# Patient Record
Sex: Female | Born: 1985
Health system: Southern US, Community
[De-identification: ages and names within clinical notes are randomized; demographics above are authoritative.]

---

## 2015-09-03 DIAGNOSIS — T384X5A Adverse effect of oral contraceptives, initial encounter: Secondary | ICD-10-CM | POA: Insufficient documentation

## 2015-09-03 DIAGNOSIS — N926 Irregular menstruation, unspecified: Secondary | ICD-10-CM | POA: Insufficient documentation

## 2017-07-06 DIAGNOSIS — R5383 Other fatigue: Secondary | ICD-10-CM | POA: Insufficient documentation

## 2017-07-06 DIAGNOSIS — R448 Other symptoms and signs involving general sensations and perceptions: Secondary | ICD-10-CM | POA: Insufficient documentation

## 2017-12-09 DIAGNOSIS — Z975 Presence of (intrauterine) contraceptive device: Secondary | ICD-10-CM | POA: Insufficient documentation

## 2021-06-19 ENCOUNTER — Encounter (HOSPITAL_COMMUNITY): Payer: Self-pay

## 2021-06-19 ENCOUNTER — Other Ambulatory Visit: Payer: Self-pay

## 2021-06-19 ENCOUNTER — Ambulatory Visit (HOSPITAL_COMMUNITY)
Admission: EM | Admit: 2021-06-19 | Discharge: 2021-06-19 | Disposition: A | Discharge status: Home / Self Care | Payer: 59 | Attending: Internal Medicine | Admitting: Internal Medicine

## 2021-06-19 DIAGNOSIS — J019 Acute sinusitis, unspecified: Secondary | ICD-10-CM | POA: Diagnosis not present

## 2021-06-19 MED ORDER — AMOXICILLIN-POT CLAVULANATE 875-125 MG PO TABS
1.0000 | ORAL_TABLET | Freq: Two times a day (BID) | ORAL | 0 refills | Status: DC
Start: 2021-06-19 — End: 2021-08-28

## 2021-06-19 MED ORDER — FLUTICASONE PROPIONATE 50 MCG/ACT NA SUSP: 1.0000 | Freq: Every day | NASAL | 0 refills | Status: DC

## 2021-06-19 NOTE — Discharge Instructions (Addendum)
Take medication as prescribed Please keep your appointment with the ENT specialist as scheduled. Return to urgent care if you have any concerns.

## 2021-06-19 NOTE — ED Provider Notes (Signed)
[facility]    CSN: [id number] Arrival date & time: 06/19/21  1328      History   Chief Complaint Chief Complaint  Patient presents with   Nasal Congestion    HPI Chloe Allen is a 35 y.o. female comes to the urgent care with over 2 weeks history of nasal congestion, difficulty breathing out of the left nostril and left-sided facial pain.  Patient says symptoms have worsened over the past 3 days.  No fever or chills.  No cough or sputum production.  Patient denies any purulent nasal discharge.  She has some headache.  Patient has tried over-the-counter fluticasone, saline nasal spray, nasal rinse.   HPI  History reviewed. No pertinent past medical history.  There are no problems to display for this patient.   History reviewed. No pertinent surgical history.  OB History   No obstetric history on file.      Home Medications    Prior to Admission medications   Medication Sig Start Date End Date Taking? Authorizing Provider  amoxicillin-clavulanate (AUGMENTIN) 875-125 MG tablet Take 1 tablet by mouth every 12 (twelve) hours. 06/19/21  Yes Gustavo Dispenza, Britta Mccreedy, MD  fluticasone (FLONASE) 50 MCG/ACT nasal spray Place 1 spray into both nostrils daily. 06/19/21  Yes Jeana Kersting, Britta Mccreedy, MD    Family History History reviewed. No pertinent family history.  Social History Social History   Tobacco Use   Smoking status: Never   Smokeless tobacco: Never  Vaping Use   Vaping Use: Never used  Substance Use Topics   Drug use: Never     Allergies   Kiwi extract   Review of Systems Review of Systems  HENT:  Positive for congestion and nosebleeds. Negative for facial swelling, mouth sores, postnasal drip, rhinorrhea and sore throat.   Respiratory: Negative.    Gastrointestinal: Negative.     Physical Exam Triage Vital Signs ED Triage Vitals  Enc Vitals Group     BP 06/19/21 1403 120/84     Pulse Rate 06/19/21 1403 93     Resp 06/19/21 1403 17     Temp  06/19/21 1403 98.2 F (36.8 C)     Temp Source 06/19/21 1403 Oral     SpO2 06/19/21 1403 97 %     Weight --      Height --      Head Circumference --      Peak Flow --      Pain Score 06/19/21 1401 0     Pain Loc --      Pain Edu? --      Excl. in GC? --    No data found.  Updated Vital Signs BP 120/84 (BP Location: Right Arm)   Pulse 93   Temp 98.2 F (36.8 C) (Oral)   Resp 17   SpO2 97%   Visual Acuity Right Eye Distance:   Left Eye Distance:   Bilateral Distance:    Right Eye Near:   Left Eye Near:    Bilateral Near:     Physical Exam Vitals and nursing note reviewed.  Constitutional:      General: She is not in acute distress.    Appearance: She is not ill-appearing.  HENT:     Right Ear: Tympanic membrane normal.     Left Ear: Tympanic membrane normal.     Ears:     Comments: Tenderness over the left maxillary sinus.  Nasal exam is remarkable for edematous turbinates.    Nose: No  congestion or rhinorrhea.  Cardiovascular:     Rate and Rhythm: Normal rate and regular rhythm.  Musculoskeletal:        General: Normal range of motion.  Neurological:     Mental Status: She is alert.     UC Treatments / Results  Labs (all labs ordered are listed, but only abnormal results are displayed) Labs Reviewed - No data to display  EKG   Radiology No results found.  Procedures Procedures (including critical care time)  Medications Ordered in UC Medications - No data to display  Initial Impression / Assessment and Plan / UC Course  I have reviewed the triage vital signs and the nursing notes.  Pertinent labs & imaging results that were available during my care of the patient were reviewed by me and considered in my medical decision making (see chart for details).     1.  Acute sinusitis with symptoms greater than 10 days: Augmentin 875-125 mg 1 tablet twice daily for 7 days Fluticasone nasal spray Keep your ENT appointment Return to urgent care if  you have any worsening symptoms. Final Clinical Impressions(s) / UC Diagnoses   Final diagnoses:  Acute sinusitis with symptoms > 10 days     Discharge Instructions      Take medication as prescribed Please keep your appointment with the ENT specialist as scheduled. Return to urgent care if you have any concerns.   ED Prescriptions     Medication Sig Dispense Auth. Provider   amoxicillin-clavulanate (AUGMENTIN) 875-125 MG tablet Take 1 tablet by mouth every 12 (twelve) hours. 14 tablet Sincere Liuzzi, Britta Mccreedy, MD   fluticasone (FLONASE) 50 MCG/ACT nasal spray Place 1 spray into both nostrils daily. 16 g Jackey Housey, Britta Mccreedy, MD      PDMP not reviewed this encounter.   Merrilee Jansky, MD 06/19/21 1600

## 2021-06-19 NOTE — ED Triage Notes (Signed)
Pt in with c/o nasal congestion, nasal drainage, headache that has worsened over the last 3 days but started 6 weeks ago  Pt has been taking Otc meds with minor relief

## 2021-07-09 DIAGNOSIS — J343 Hypertrophy of nasal turbinates: Secondary | ICD-10-CM | POA: Diagnosis not present

## 2021-07-09 DIAGNOSIS — J3489 Other specified disorders of nose and nasal sinuses: Secondary | ICD-10-CM | POA: Diagnosis not present

## 2021-07-09 DIAGNOSIS — R0981 Nasal congestion: Secondary | ICD-10-CM | POA: Diagnosis not present

## 2021-07-21 ENCOUNTER — Other Ambulatory Visit (HOSPITAL_BASED_OUTPATIENT_CLINIC_OR_DEPARTMENT_OTHER): Payer: Self-pay | Admitting: Specialist

## 2021-07-21 DIAGNOSIS — G9600 Cerebrospinal fluid leak, unspecified: Secondary | ICD-10-CM

## 2021-07-23 ENCOUNTER — Other Ambulatory Visit: Payer: Self-pay

## 2021-07-23 ENCOUNTER — Ambulatory Visit (HOSPITAL_BASED_OUTPATIENT_CLINIC_OR_DEPARTMENT_OTHER)
Admission: RE | Admit: 2021-07-23 | Discharge: 2021-07-23 | Disposition: A | Discharge status: Home / Self Care | Payer: 59 | Source: Ambulatory Visit | Attending: Specialist | Admitting: Specialist

## 2021-07-23 DIAGNOSIS — J339 Nasal polyp, unspecified: Secondary | ICD-10-CM | POA: Diagnosis not present

## 2021-07-23 DIAGNOSIS — G9600 Cerebrospinal fluid leak, unspecified: Secondary | ICD-10-CM | POA: Insufficient documentation

## 2021-08-28 ENCOUNTER — Ambulatory Visit (INDEPENDENT_AMBULATORY_CARE_PROVIDER_SITE_OTHER): Payer: 59 | Admitting: Family Medicine

## 2021-08-28 ENCOUNTER — Other Ambulatory Visit: Payer: Self-pay

## 2021-08-28 ENCOUNTER — Encounter (HOSPITAL_BASED_OUTPATIENT_CLINIC_OR_DEPARTMENT_OTHER): Payer: Self-pay | Admitting: Family Medicine

## 2021-08-28 VITALS — BP 126/74 | Ht 63.0 in | Wt 189.0 lb

## 2021-08-28 DIAGNOSIS — Z7689 Persons encountering health services in other specified circumstances: Secondary | ICD-10-CM | POA: Diagnosis not present

## 2021-08-28 DIAGNOSIS — Z600 Problems of adjustment to life-cycle transitions: Secondary | ICD-10-CM | POA: Diagnosis not present

## 2021-08-28 DIAGNOSIS — Z0000 Encounter for general adult medical examination without abnormal findings: Secondary | ICD-10-CM | POA: Diagnosis not present

## 2021-08-28 NOTE — Assessment & Plan Note (Signed)
Overall, well-appearing female Discussed general recommendations, particular related to immunizations, cancer screening recommendations Discussed health maintenance and anticipatory guidance She declines influenza vaccine today She will be referred to OB/GYN for continued cervical cancer screening Given family history of various metabolic conditions, discussed role of preventive measures, working on lifestyle modifications to work towards gradual, healthy weight loss We will check labs today as below

## 2021-08-28 NOTE — Progress Notes (Signed)
New Patient Office Visit  Subjective:  Patient ID: Chloe Allen, female    DOB: April 07, 1986  Age: 35 y.o. MRN: [id number]  CC:  Chief Complaint  Patient presents with   Establish Care    Prior PCP at [facility] in [place]. No concerns or complaints today. Would like a wellness exam    HPI Chloe Allen is a 35 year old female presenting to establish in clinic.  She is requesting CPE today.  Denies any significant past medical history.  Reports that she had her tetanus shot in 2014 and is thus up-to-date Reports that she is up-to-date on both dental exams as well as vision screening Recalls having cervical cancer screening completed in 2017 with HPV cotesting and reports that findings from this were normal.  She would thus be due for repeat screening, is interested in referral to OB/GYN to have this completed Reports being up-to-date on coronavirus immunizations Declines flu immunization today  Does report that she has had some issues related to her depressive symptoms, feeling more anxious since the passing of her father in 2020.  She would be interested in referral to undergo counseling regarding this  Patient is [place] from [place].  She was most recently living in [place] which is where she lived for the past 17 years.  She has been in the area here for about a year and a half.  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  She has 2 children who are 63 and 68 years old.  Her husband works as a Adult nurse for [employer].  History reviewed. No pertinent past medical history.  History reviewed. No pertinent surgical history.  Family History  Problem Relation Age of Onset   Hypertension Mother    Hyperlipidemia Father    Hyperlipidemia Maternal Grandmother    Hypertension Maternal Grandmother    Diabetes Maternal Grandmother    Hypertension Maternal Grandfather    Hyperlipidemia Maternal Grandfather    Diabetes Maternal  Grandfather    Hypertension Paternal Grandmother    Hyperlipidemia Paternal Grandmother    Diabetes Paternal Grandmother    Hypertension Paternal Grandfather    Hyperlipidemia Paternal Grandfather    Diabetes Paternal Grandfather     Social History   Socioeconomic History   Marital status: Unknown    Spouse name: Not on file   Number of children: Not on file   Years of education: Not on file   Highest education level: Not on file  Occupational History   Not on file  Tobacco Use   Smoking status: Never   Smokeless tobacco: Never  Vaping Use   Vaping Use: Never used  Substance and Sexual Activity   Alcohol use: Yes    Comment: occasionally   Drug use: Never   Sexual activity: Yes  Other Topics Concern   Not on file  Social History Narrative   Not on file   Social Determinants of Health   Financial Resource Strain: Not on file  Food Insecurity: Not on file  Transportation Needs: Not on file  Physical Activity: Not on file  Stress: Not on file  Social Connections: Not on file  Intimate Partner Violence: Not on file    Objective:   Today's Vitals: BP 126/74   Ht 5\' 3"  (1.6 m)   Wt 189 lb (85.7 kg)   LMP 07/26/2021   BMI 33.48 kg/m   Physical Exam Constitutional:      General: She is not in acute distress.  Appearance: Normal appearance.  HENT:     Head: Normocephalic and atraumatic.     Right Ear: External ear normal.     Left Ear: External ear normal.     Nose: Nose normal. No rhinorrhea.     Mouth/Throat:     Mouth: Mucous membranes are moist.     Pharynx: Oropharynx is clear.  Eyes:     Extraocular Movements: Extraocular movements intact.     Conjunctiva/sclera: Conjunctivae normal.     Pupils: Pupils are equal, round, and reactive to light.  Cardiovascular:     Rate and Rhythm: Normal rate and regular rhythm.     Pulses: Normal pulses.     Heart sounds: Normal heart sounds. No murmur heard. Pulmonary:     Effort: Pulmonary effort is normal.  No respiratory distress.     Breath sounds: Normal breath sounds.  Abdominal:     General: Bowel sounds are normal. There is no distension.     Palpations: Abdomen is soft. There is no mass.     Tenderness: There is no abdominal tenderness. There is no guarding.  Musculoskeletal:     Cervical back: Normal range of motion and neck supple.  Skin:    General: Skin is warm and dry.  Neurological:     General: No focal deficit present.     Mental Status: She is alert and oriented to person, place, and time.  Psychiatric:        Mood and Affect: Mood normal.        Thought Content: Thought content normal.    Assessment & Plan:   Problem List Items Addressed This Visit       Other   Wellness examination - Primary    Overall, well-appearing female Discussed general recommendations, particular related to immunizations, cancer screening recommendations Discussed health maintenance and anticipatory guidance She declines influenza vaccine today She will be referred to OB/GYN for continued cervical cancer screening Given family history of various metabolic conditions, discussed role of preventive measures, working on lifestyle modifications to work towards gradual, healthy weight loss We will check labs today as below      Relevant Orders   CBC with Differential/Platelet   Comprehensive metabolic panel   Lipid panel   Hemoglobin A1c   TSH Rfx on Abnormal to Free T4   Vitamin B12   VITAMIN D 25 Hydroxy (Vit-D Deficiency, Fractures)   Phase of life problem    Discussed options related to current symptoms which began after the passing of her father Patient is interested in proceeding with counseling, place referral to Dr. Marge Duncans office Patient would prefer to follow-up with [place] as needed, advised that if she has any further questions or concerns to schedule follow-up to discuss alternative treatment options such as pharmacotherapy      Other Visit Diagnoses     Establishing care  with new doctor, encounter for       Relevant Orders   Ambulatory referral to Obstetrics / Gynecology       Outpatient Encounter Medications as of 08/28/2021  Medication Sig   [DISCONTINUED] amoxicillin-clavulanate (AUGMENTIN) 875-125 MG tablet Take 1 tablet by mouth every 12 (twelve) hours.   [DISCONTINUED] fluticasone (FLONASE) 50 MCG/ACT nasal spray Place 1 spray into both nostrils daily.   No facility-administered encounter medications on file as of 08/28/2021.    Follow-up: No follow-ups on file.  Plan for follow-up in about 9 months to complete CPE or sooner as needed.  Hosie Poisson Peru,  MD

## 2021-08-28 NOTE — Assessment & Plan Note (Signed)
Discussed options related to current symptoms which began after the passing of her father Patient is interested in proceeding with counseling, place referral to Dr. Marge Duncans office Patient would prefer to follow-up with [place] as needed, advised that if she has any further questions or concerns to schedule follow-up to discuss alternative treatment options such as pharmacotherapy

## 2021-08-28 NOTE — Patient Instructions (Signed)
  Medication Instructions:  Your physician recommends that you continue on your current medications as directed. Please refer to the Current Medication list given to you today. --If you need a refill on any your medications before your next appointment, please call your pharmacy first. If no refills are authorized on file call the office.-- Lab Work: Your physician has recommended that you have lab work today: CBC, CMP, Lipid, Vit B12, Vit D, A1C If you have labs (blood work) drawn today and your tests are completely normal, you will receive your results via MyChart message OR a phone call from our staff.  Please ensure you check your voicemail in the event that you authorized detailed messages to be left on a delegated number. If you have any lab test that is abnormal or we need to change your treatment, we will call you to review the results.  Referrals/Procedures/Imaging: A referral has been placed for you to Dr. Bosie Clos with [facility]. Dr. Bosie Clos is a psychologist who specializes in cognitive and behavioral therapy, he does not prescribe medications. Someone from the scheduling department will be in contact with you in regards to coordinating your consultation. If you do not hear from any of the schedulers within 7-10 business days please give our office a call   Follow-Up: Your next appointment:   Your physician recommends that you schedule a follow-up appointment in: 9 MONTHS with Dr. de Peru  You will receive a text message or e-mail with a link to a survey about your care and experience with [place] today! We would greatly appreciate your feedback!   Thanks for letting us be apart of your health journey!!  Primary Care and Sports Medicine   Dr. Ceasar Mons Peru   We encourage you to activate your patient portal called "MyChart".  Sign up information is provided on this After Visit Summary.  MyChart is used to connect with patients for Virtual Visits (Telemedicine).   Patients are able to view lab/test results, encounter notes, upcoming appointments, etc.  Non-urgent messages can be sent to your provider as well. To learn more about what you can do with MyChart, please visit --  [email or web address].

## 2021-08-29 LAB — CBC WITH DIFFERENTIAL/PLATELET
Basophils Absolute: 0 10*3/uL (ref 0.0–0.2)
Basos: 0 %
EOS (ABSOLUTE): 0.3 10*3/uL (ref 0.0–0.4)
Eos: 6 %
Hematocrit: 38.7 % (ref 34.0–46.6)
Hemoglobin: 12.6 g/dL (ref 11.1–15.9)
Immature Grans (Abs): 0 10*3/uL (ref 0.0–0.1)
Immature Granulocytes: 0 %
Lymphocytes Absolute: 1.8 10*3/uL (ref 0.7–3.1)
Lymphs: 37 %
MCH: 26.8 pg (ref 26.6–33.0)
MCHC: 32.6 g/dL (ref 31.5–35.7)
MCV: 82 fL (ref 79–97)
Monocytes Absolute: 0.5 10*3/uL (ref 0.1–0.9)
Monocytes: 11 %
Neutrophils Absolute: 2.2 10*3/uL (ref 1.4–7.0)
Neutrophils: 46 %
Platelets: 246 10*3/uL (ref 150–450)
RBC: 4.7 x10E6/uL (ref 3.77–5.28)
RDW: 13.9 % (ref 11.7–15.4)
WBC: 4.8 10*3/uL (ref 3.4–10.8)

## 2021-08-29 LAB — HEMOGLOBIN A1C
Est. average glucose Bld gHb Est-mCnc: 114 mg/dL
Hgb A1c MFr Bld: 5.6 % (ref 4.8–5.6)

## 2021-08-29 LAB — COMPREHENSIVE METABOLIC PANEL
ALT: 18 IU/L (ref 0–32)
AST: 16 IU/L (ref 0–40)
Albumin/Globulin Ratio: 1.7 (ref 1.2–2.2)
Albumin: 4.4 g/dL (ref 3.8–4.8)
Alkaline Phosphatase: 81 IU/L (ref 44–121)
BUN/Creatinine Ratio: 15 (ref 9–23)
BUN: 11 mg/dL (ref 6–20)
Bilirubin Total: <0.2 mg/dL (ref 0.0–1.2)
CO2: 22 mmol/L (ref 20–29)
Calcium: 8.9 mg/dL (ref 8.7–10.2)
Chloride: 106 mmol/L (ref 96–106)
Creatinine, Ser: 0.74 mg/dL (ref 0.57–1.00)
Globulin, Total: 2.6 g/dL (ref 1.5–4.5)
Glucose: 90 mg/dL (ref 70–99)
Potassium: 4.6 mmol/L (ref 3.5–5.2)
Sodium: 143 mmol/L (ref 134–144)
Total Protein: 7 g/dL (ref 6.0–8.5)
eGFR: 107 mL/min/{1.73_m2} (ref >59)

## 2021-08-29 LAB — LIPID PANEL
Chol/HDL Ratio: 2.9 ratio (ref 0.0–4.4)
Cholesterol, Total: 162 mg/dL (ref 100–199)
HDL: 56 mg/dL (ref >39)
LDL Chol Calc (NIH): 94 mg/dL (ref 0–99)
Triglycerides: 62 mg/dL (ref 0–149)
VLDL Cholesterol Cal: 12 mg/dL (ref 5–40)

## 2021-08-29 LAB — TSH RFX ON ABNORMAL TO FREE T4: TSH: 2.65 u[IU]/mL (ref 0.450–4.500)

## 2021-08-29 LAB — VITAMIN B12: Vitamin B-12: 627 pg/mL (ref 232–1245)

## 2021-08-29 LAB — VITAMIN D 25 HYDROXY (VIT D DEFICIENCY, FRACTURES): Vit D, 25-Hydroxy: 19.7 ng/mL — ABNORMAL LOW (ref 30.0–100.0)

## 2021-08-31 ENCOUNTER — Other Ambulatory Visit (HOSPITAL_BASED_OUTPATIENT_CLINIC_OR_DEPARTMENT_OTHER): Payer: Self-pay

## 2021-08-31 ENCOUNTER — Telehealth (HOSPITAL_BASED_OUTPATIENT_CLINIC_OR_DEPARTMENT_OTHER): Payer: Self-pay

## 2021-08-31 MED ORDER — VITAMIN D3 25 MCG (1000 UT) PO CAPS
1000.0000 [IU] | ORAL_CAPSULE | Freq: Every day | ORAL | 0 refills | Status: DC
  Filled 2021-08-31: qty 90, 90d supply, fill #0

## 2021-08-31 NOTE — Telephone Encounter (Signed)
Patient is aware and agreeable to lab results Patient requests to have vit d prescription sent to pharmacy Patient is agreeable to supplementation and rechecking levels in 3 mos

## 2021-08-31 NOTE — Telephone Encounter (Signed)
-----   Message from Hosie Poisson Peru, MD sent at 08/31/2021  8:00 AM EDT ----- White blood cell count and red blood cell count are normal with normal hemoglobin.  Electrolytes, kidney function and liver function are all normal.  Cholesterol panel is normal.  Hemoglobin A1c is within normal range at 5.6%.  Thyroid function is normal.  Vitamin B12 level is normal.  Vitamin D is low at 19.7.  Would recommend proceeding with vitamin D supplementation with 1000 international units daily.  This can be completed with over-the-counter supplement or by prescription as per patient preference.  Would plan to recheck vitamin D level after about 3 months of supplementation.

## 2021-09-01 ENCOUNTER — Other Ambulatory Visit (HOSPITAL_BASED_OUTPATIENT_CLINIC_OR_DEPARTMENT_OTHER): Payer: Self-pay

## 2021-10-09 ENCOUNTER — Ambulatory Visit: Payer: 59 | Admitting: Psychologist

## 2021-11-13 ENCOUNTER — Ambulatory Visit (INDEPENDENT_AMBULATORY_CARE_PROVIDER_SITE_OTHER): Payer: 59 | Admitting: Psychologist

## 2021-11-13 DIAGNOSIS — Z634 Disappearance and death of family member: Secondary | ICD-10-CM

## 2021-11-13 DIAGNOSIS — F3281 Premenstrual dysphoric disorder: Secondary | ICD-10-CM | POA: Diagnosis not present

## 2021-11-13 NOTE — Progress Notes (Signed)
                Elloise Roark, PsyD 

## 2021-11-13 NOTE — Plan of Care (Signed)
Goals °Alleviate depressive symptoms °Recognize, accept, and cope with depressive feelings °Develop healthy thinking patterns °Develop healthy interpersonal relationships ° °Objectives °Cooperate with a medication evaluation by a physician °Verbalize an accurate understanding of depression °Verbalize an understanding of the treatment °Identify and replace thoughts that support depression °Learn and implement behavioral strategies °Verbalize an understanding and resolution of current interpersonal problems °Learn and implement problem solving and decision making skills °Learn and implement conflict resolution skills to resolve interpersonal problems °Verbalize an understanding of healthy and unhealthy emotions verbalize insight into how past relationships may be influence current experiences with depression °Use mindfulness and acceptance strategies and increase value based behavior  °Increase hopeful statements about the future.  °Interventions °Consistent with treatment model, discuss how change in cognitive, behavioral, and interpersonal can help client alleviate depression °CBT °Behavioral activation help the client explore the relationship, nature of the dispute,  °Help the client develop new interpersonal skills and relationships °Conduct Problem so living therapy °Teach conflict resolution skills °Use a process-experiential approach °Conduct TLDP °Conduct ACT °Evaluate need for psychotropic medication °Monitor adherence to medication  ° °Goals °Begin a healthy grieving process °Objectives °Tell in detail the story of the current loss that is triggering symptoms °Read books on the topic of grief °Watch videos on the theme of grief °Begin verbalizing feelings associated with the loss °Attend a grief support group °express thoughts and feelings about the deceased °Identify and voice positives about the deceased °implement acts of spiritual faith  °Interventions °create a safe environment and actively build  trust °use empathy, compassion, and support °ask the patient to write a letter to the lost person °conduct empty chair °ask the patient to discuss and list the positives and negative aspects of the person °encourage patient to rely upon his/her spiritual faith  °ask client to read books on grief °ask patient to watch videos about grief °assist patient in identifying emotions  °ask patient to attend support group  ° °

## 2021-11-13 NOTE — Progress Notes (Signed)
[facility] Counselor Initial Adult Exam  Name: Chloe Allen Date: 11/13/2021 MRN: [id number] DOB: [birth year] PCP: de Peru, Raymond J, MD  Time spent: 9:04 am to 9:30 am; total time: 26 minutes.   This session was held via video webex teletherapy due to the coronavirus risk at this time. The patient consented to video teletherapy and was located at her home during this session. She is aware it is the responsibility of the patient to secure confidentiality on her end of the session. The provider was in a private home office for the duration of this session. Limits of confidentiality were discussed with the patient.   Guardian/Payee:  NA    Paperwork requested: No   Reason for Visit /Presenting Problem: Patient is exhibiting premenstrual dysphoric symptoms and grief related symptoms.   Mental Status Exam: Appearance:   Neat     Behavior:  Appropriate  Motor:  Normal  Speech/Language:   Normal Rate  Affect:  Appropriate  Mood:  normal  Thought process:  normal  Thought content:    WNL  Sensory/Perceptual disturbances:    WNL  Orientation:  oriented to person, place, time/date, and situation  Attention:  Good  Concentration:  Good  Memory:  WNL  Fund of knowledge:   Good  Insight:    Fair  Judgment:   Good  Impulse Control:  Good    Reported Symptoms:  The patient endorsed experiencing the following: marked affective lability, irritability, feeling down, anxiety, being overwhelmed, and out of control. She denied suicidal and homicidal ideation. Per the patient, these symptoms are present at least one week before the onset of menses and then dissipate postmenses.   Risk Assessment: Danger to Self:  No Self-injurious Behavior: No Danger to Others: No Duty to Warn:no Physical Aggression / Violence:No  Access to Firearms a concern: No  Gang Involvement:No  Patient / guardian was educated about steps to take if suicide or homicide risk level increases between  visits: n/a While future psychiatric events cannot be accurately predicted, the patient does not currently require acute inpatient psychiatric care and does not currently meet [facility] involuntary commitment criteria.  Substance Abuse History: Current substance abuse: No     Past Psychiatric History:   Previous psychological history is significant for Grief Outpatient Providers:NA History of Psych Hospitalization: No  Psychological Testing:  NA    Abuse History:  Victim of: No.,  NA    Report needed: No. Victim of Neglect:No. Perpetrator of  NA   Witness / Exposure to Domestic Violence: No   Protective Services Involvement: No  Witness to [employer] Violence:  No   Family History:  Family History  Problem Relation Age of Onset   Hypertension Mother    Hyperlipidemia Father    Hyperlipidemia Maternal Grandmother    Hypertension Maternal Grandmother    Diabetes Maternal Grandmother    Hypertension Maternal Grandfather    Hyperlipidemia Maternal Grandfather    Diabetes Maternal Grandfather    Hypertension Paternal Grandmother    Hyperlipidemia Paternal Grandmother    Diabetes Paternal Grandmother    Hypertension Paternal Grandfather    Hyperlipidemia Paternal Grandfather    Diabetes Paternal Grandfather     Living situation: the patient lives with their family. Patient lives with her husband, and two children  Sexual Orientation: Straight  Relationship Status: married  Name of spouse / other:Karmesh If a parent, number of children / ages:Patient has two children who are 28 and 84 years old.   Support Systems:  spouse  Financial Stress:  No   Income/Employment/Disability: Employment. Patient works as a NP  Financial planner: No   Educational History: Education: post Engineer, maintenance (IT) work or degree  Religion/Sprituality/World View: Hindu  Any cultural differences that may affect / interfere with treatment:  not applicable   Recreation/Hobbies: Spending time  with family  Stressors: Loss of father    Strengths: Supportive Relationships and Family  Barriers:  NA   Legal History: Pending legal issue / charges: The patient has no significant history of legal issues. History of legal issue / charges:  NA  Medical History/Surgical History: reviewed No past medical history on file.  No past surgical history on file.  Medications: Current Outpatient Medications  Medication Sig Dispense Refill   Cholecalciferol (VITAMIN D3) 25 MCG (1000 UT) CAPS Take 1 capsule (1,000 Units total) by mouth daily. 90 capsule 0   No current facility-administered medications for this visit.    Allergies  Allergen Reactions   Kiwi Extract    Latex    Pineapple     Diagnoses:  F32.81 premenstrual dysphoric disorder  Plan of Care: The patient is a 36 year old woman who identifies with the Hindu culture who was referred for counseling. The patient lives at home with her husband, and two children. The patient meets criteria for a diagnosis of F32.81 premenstrual dysphoric disorder based off of the following: marked affective lability, irritability, feeling down, anxiety, being overwhelmed, and out of control. She denied suicidal and homicidal ideation. Per the patient, these symptoms are present at least one week before the onset of menses and then dissipate postmenses. She also meets criteria for a diagnosis of Z63.4 uncomplicated bereavement.   The patient stated that she wanted coping skills and to work through grief  This psychologist makes the recommendation that the patient participate in at least bi-weekly therapy to assist her in addressing her goals.  Hilbert Corrigan, PsyD

## 2021-12-04 ENCOUNTER — Ambulatory Visit: Payer: 59 | Admitting: Psychologist

## 2022-03-25 ENCOUNTER — Encounter (HOSPITAL_BASED_OUTPATIENT_CLINIC_OR_DEPARTMENT_OTHER): Payer: Self-pay | Admitting: Family Medicine

## 2022-03-25 ENCOUNTER — Ambulatory Visit (HOSPITAL_BASED_OUTPATIENT_CLINIC_OR_DEPARTMENT_OTHER): Payer: 59 | Admitting: Family Medicine

## 2022-03-25 VITALS — BP 122/82 | HR 86 | Ht 64.0 in | Wt 199.0 lb

## 2022-03-25 DIAGNOSIS — Z111 Encounter for screening for respiratory tuberculosis: Secondary | ICD-10-CM

## 2022-03-25 DIAGNOSIS — Z139 Encounter for screening, unspecified: Secondary | ICD-10-CM | POA: Diagnosis not present

## 2022-03-25 NOTE — Assessment & Plan Note (Signed)
Patient presenting to have form completed related to upcoming school program.  Form requesting documentation for prior vaccine administration or verification of immunity with titers.  She has had titers completed previously and has documentation with her today of this prior testing.  She is in need of tuberculosis screening either through two-step process or QuantiFERON gold.  She prefers to proceed with QuantiFERON testing, order placed today.  She denies any other acute concerns or new issues since last appointment which was physical exam about 7 months ago.  She did not receive most recent influenza vaccine, however we are no longer in flu season.  Discussed options with patient, indicated on form that we are no longer in flu season and thus would not be required to have flu shot at this time.  If any further is needed from her program, she will let us know. Form completed for patient, copy of form as well as immunization records/titer results will be scanned into chart

## 2022-03-25 NOTE — Progress Notes (Signed)
    Procedures performed today:    None.  Independent interpretation of notes and tests performed by another provider:   None.  Brief History, Exam, Impression, and Recommendations:    BP 122/82   Pulse 86   Ht 5\' 4"  (1.626 m)   Wt 199 lb (90.3 kg)   SpO2 96%   BMI 34.16 kg/m   Encounter for screening Patient presenting to have form completed related to upcoming school program.  Form requesting documentation for prior vaccine administration or verification of immunity with titers.  She has had titers completed previously and has documentation with her today of this prior testing.  She is in need of tuberculosis screening either through two-step process or QuantiFERON gold.  She prefers to proceed with QuantiFERON testing, order placed today.  She denies any other acute concerns or new issues since last appointment which was physical exam about 7 months ago.  She did not receive most recent influenza vaccine, however we are no longer in flu season.  Discussed options with patient, indicated on form that we are no longer in flu season and thus would not be required to have flu shot at this time.  If any further is needed from her program, she will let know. Form completed for patient, copy of form as well as immunization records/titer results will be scanned into chart  Plan for follow-up in about 2 months at previously scheduled appointment for CPE   ___________________________________________ Chloe Massing de [place], MD, ABFM, CAQSM Primary Care and Sports Medicine [facility]

## 2022-03-25 NOTE — Patient Instructions (Signed)
  Medication Instructions:  Your physician recommends that you continue on your current medications as directed. Please refer to the Current Medication list given to you today. --If you need a refill on any your medications before your next appointment, please call your pharmacy first. If no refills are authorized on file call the office.-- Lab Work: Your physician has recommended that you have lab work today: TB  If you have labs (blood work) drawn today and your tests are completely normal, you will receive your results via MyChart message OR a phone call from our staff.  Please ensure you check your voicemail in the event that you authorized detailed messages to be left on a delegated number. If you have any lab test that is abnormal or we need to change your treatment, we will call you to review the results.    Follow-Up: Your next appointment:   Your physician recommends that you schedule a follow-up appointment in: Keep appointment in July with Dr. de [place]  You will receive a text message or e-mail with a link to a survey about your care and experience with [place] today! We would greatly appreciate your feedback!   Thanks for letting us be apart of your health journey!!  Primary Care and Sports Medicine   Dr. Ceasar Mons [place]   We encourage you to activate your patient portal called "MyChart".  Sign up information is provided on this After Visit Summary.  MyChart is used to connect with patients for Virtual Visits (Telemedicine).  Patients are able to view lab/test results, encounter notes, upcoming appointments, etc.  Non-urgent messages can be sent to your provider as well. To learn more about what you can do with MyChart, please visit --  [email or web address].

## 2022-03-31 LAB — QUANTIFERON-TB GOLD PLUS

## 2022-04-01 ENCOUNTER — Encounter (HOSPITAL_BASED_OUTPATIENT_CLINIC_OR_DEPARTMENT_OTHER): Payer: Self-pay | Admitting: Family Medicine

## 2022-04-01 ENCOUNTER — Other Ambulatory Visit (HOSPITAL_BASED_OUTPATIENT_CLINIC_OR_DEPARTMENT_OTHER): Payer: Self-pay

## 2022-04-01 DIAGNOSIS — Z111 Encounter for screening for respiratory tuberculosis: Secondary | ICD-10-CM

## 2022-04-13 DIAGNOSIS — Z111 Encounter for screening for respiratory tuberculosis: Secondary | ICD-10-CM | POA: Diagnosis not present

## 2022-04-16 LAB — QUANTIFERON-TB GOLD PLUS
QuantiFERON Mitogen Value: >10 IU/mL
QuantiFERON Nil Value: 0.02 IU/mL
QuantiFERON TB1 Ag Value: 0.02 IU/mL
QuantiFERON TB2 Ag Value: 0.03 IU/mL
QuantiFERON-TB Gold Plus: NEGATIVE

## 2022-04-16 NOTE — Telephone Encounter (Signed)
Spoke with patient due to miscommunication between patient and our office.  Communicated negative lab result and confirmed patient does not need to repeat test. Patient will be able to view result in MyChart.

## 2022-05-25 ENCOUNTER — Ambulatory Visit (HOSPITAL_BASED_OUTPATIENT_CLINIC_OR_DEPARTMENT_OTHER): Payer: 59 | Admitting: Advanced Practice Midwife

## 2022-05-28 ENCOUNTER — Encounter (HOSPITAL_BASED_OUTPATIENT_CLINIC_OR_DEPARTMENT_OTHER): Payer: 59 | Admitting: Family Medicine

## 2022-05-31 ENCOUNTER — Encounter (HOSPITAL_BASED_OUTPATIENT_CLINIC_OR_DEPARTMENT_OTHER): Payer: Self-pay | Admitting: Family Medicine

## 2022-07-20 ENCOUNTER — Encounter (HOSPITAL_BASED_OUTPATIENT_CLINIC_OR_DEPARTMENT_OTHER): Payer: 59 | Admitting: Advanced Practice Midwife

## 2022-08-28 IMAGING — CT CT MAXILLOFACIAL W/O CM
3 series · 16 of 47 positions shown, 19 images · non-contrast
Comparison: None.

CLINICAL DATA: CSF leak

EXAM:
CT MAXILLOFACIAL WITHOUT CONTRAST
TECHNIQUE: Multidetector CT imaging of the maxillofacial structures was
performed. Multiplanar CT image reconstructions were also generated.

[Series 2: 1 max soft · axial · 0.32mm/px · z∈[-143,-9]mm · 10 of 79 slices shown, 13 images]
[im 6/79  brain]
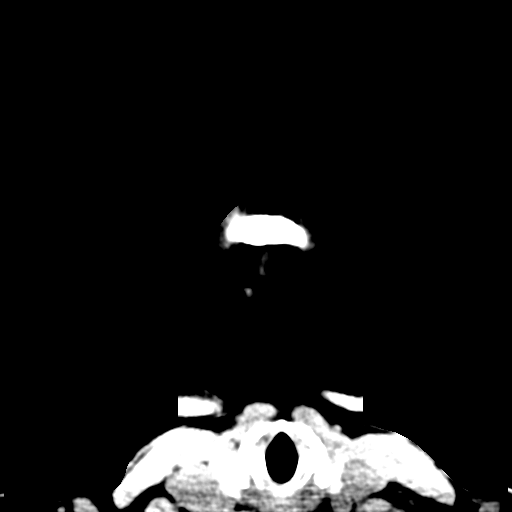
[im 6/79  bone]
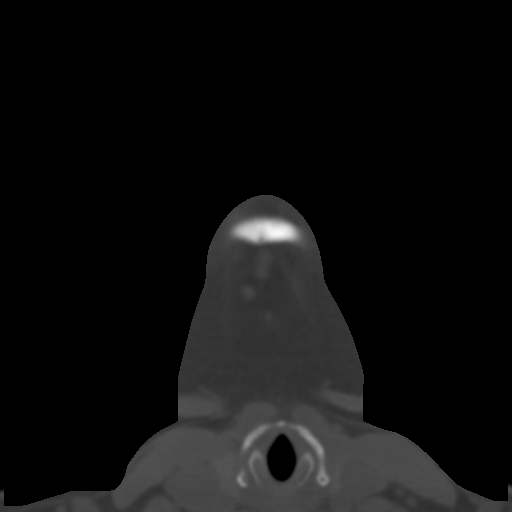
[im 14/79  bone]
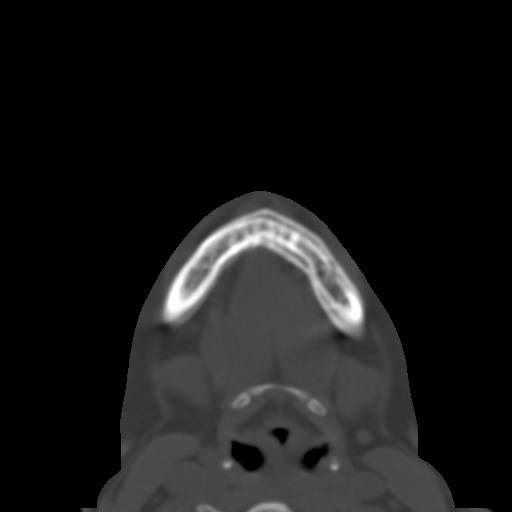
[im 22/79  bone]
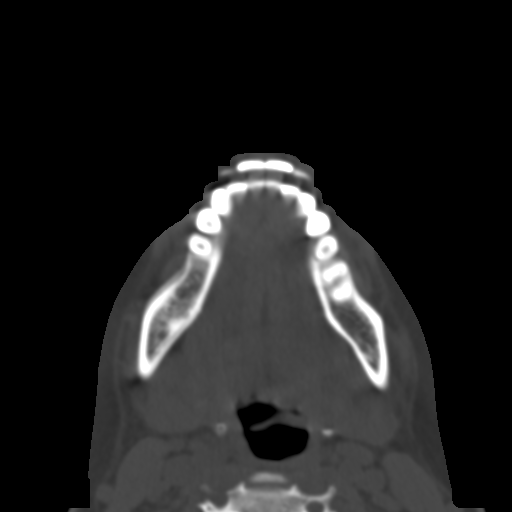
[im 27/79  bone]
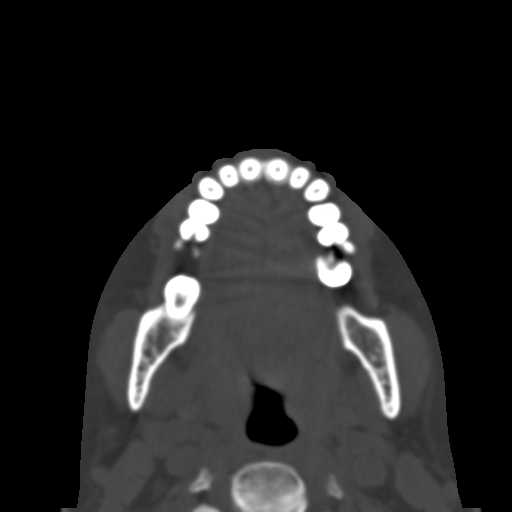
[im 35/79  brain]
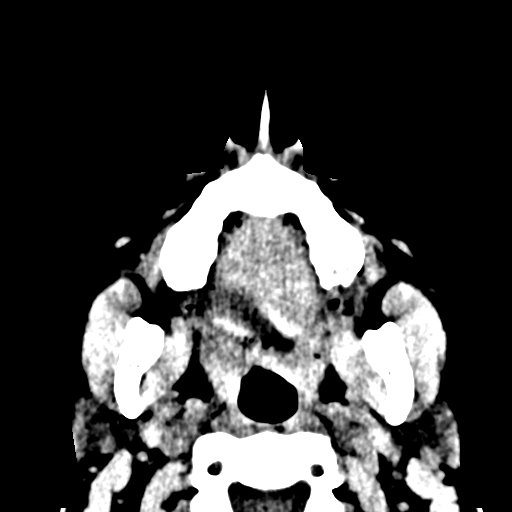
[im 35/79  bone]
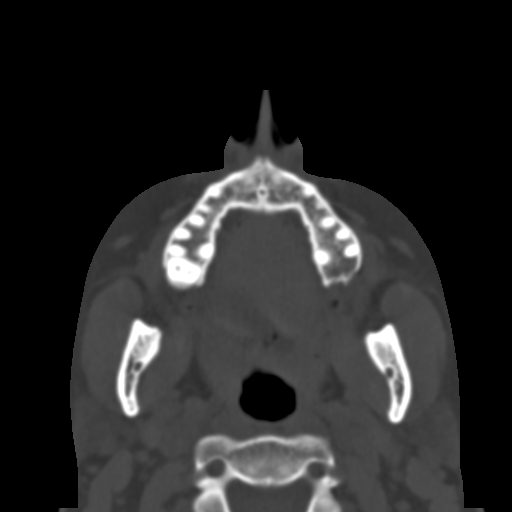
[im 44/79  bone]
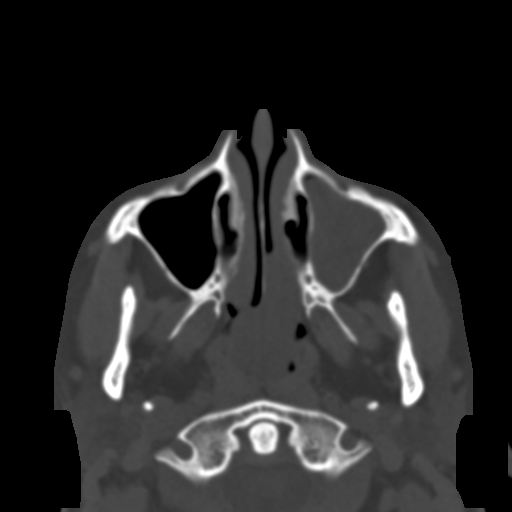
[im 52/79  bone]
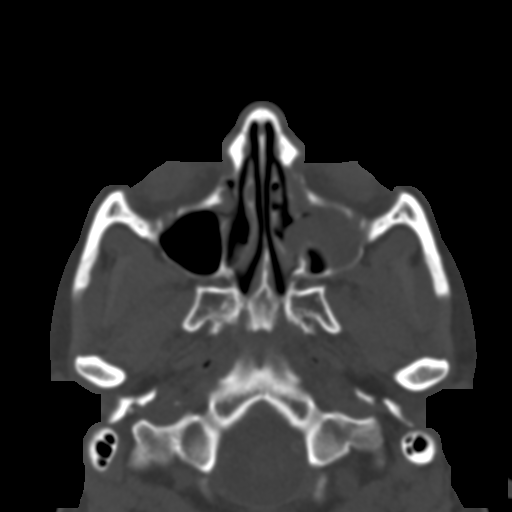
[im 60/79  bone]
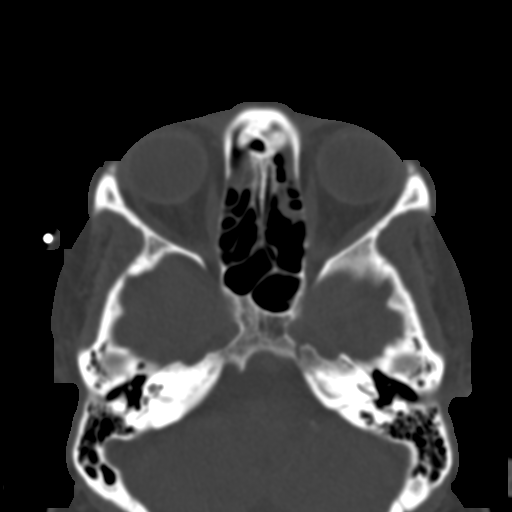
[im 65/79  brain]
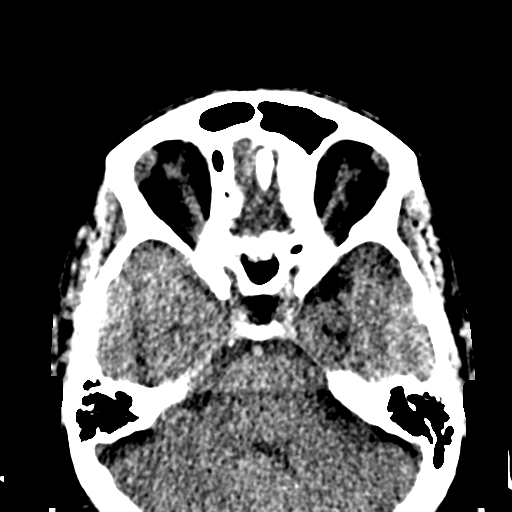
[im 65/79  bone]
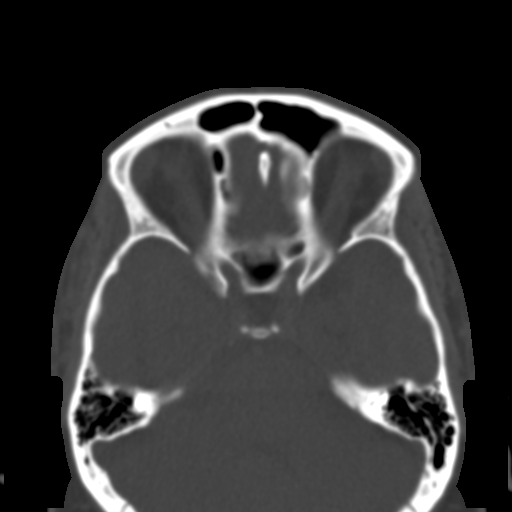
[im 73/79  bone]
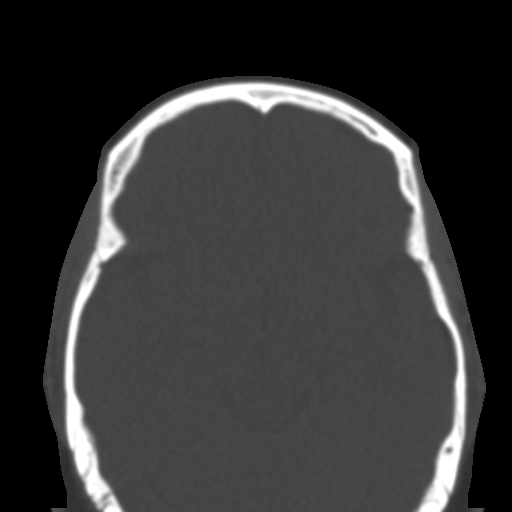

[Series 6: coronal soft · coronal · 0.32mm/px · 3 of 71 slices shown]
[im 24/71  bone]
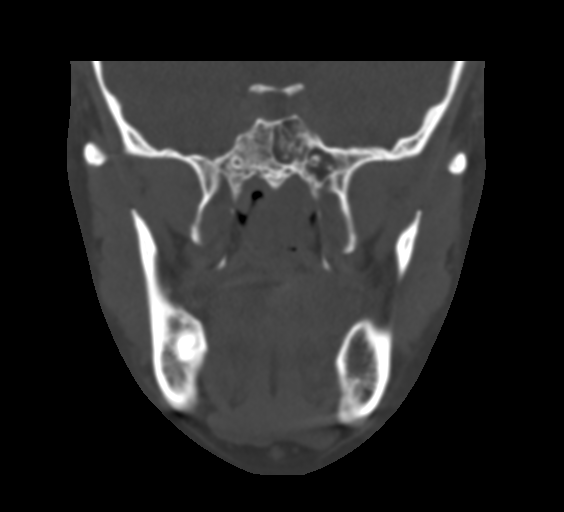
[im 32/71  bone]
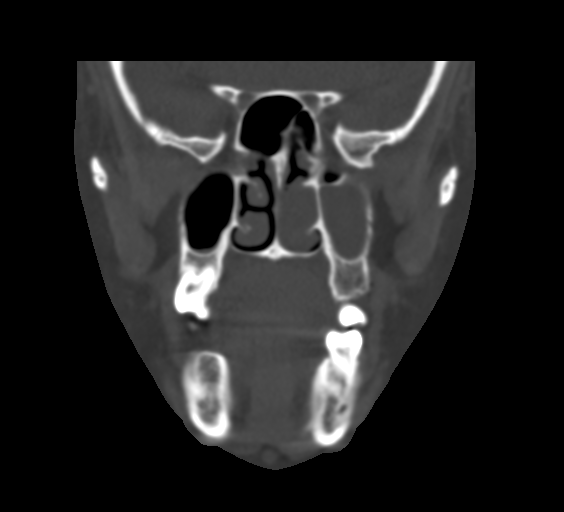
[im 39/71  bone]
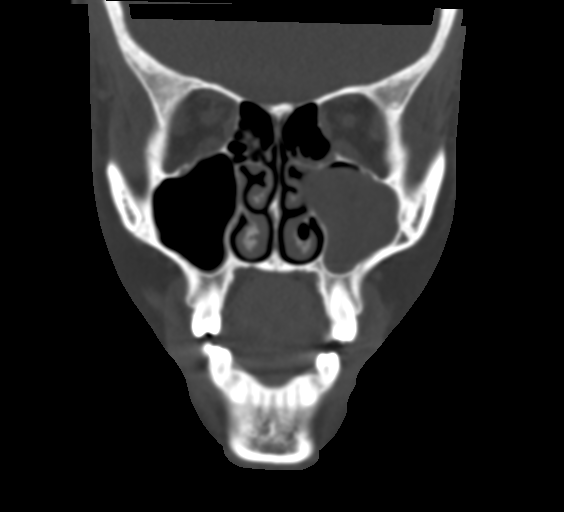

[Series 7: sagittal soft · sagittal · 0.28mm/px · 3 of 92 slices shown]
[im 31/92  bone]
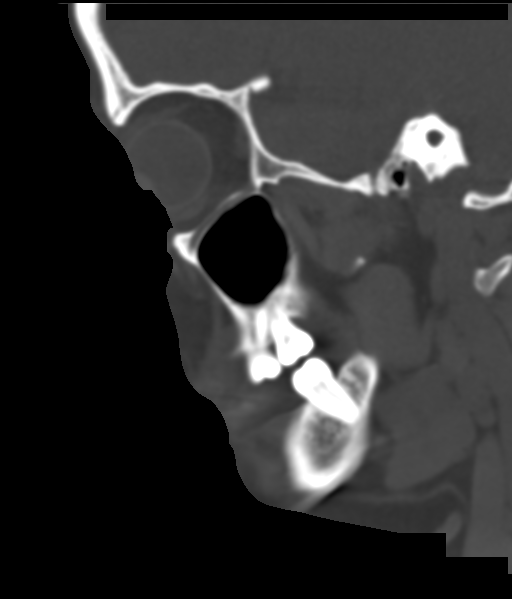
[im 46/92  bone]
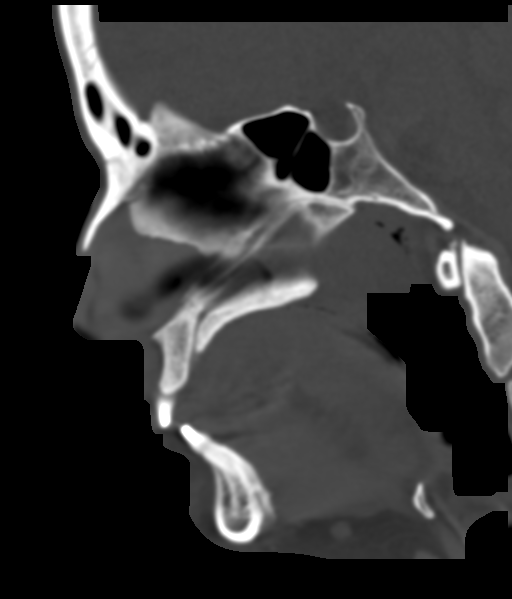
[im 61/92  bone]
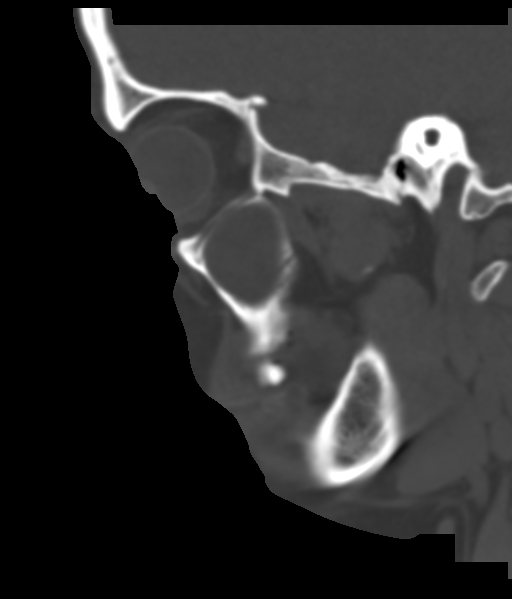

[16 of 47 positions shown; findings below may reference images not displayed]

FINDINGS: Osseous: Cribriform plate and fovea ethmoidalis appear intact. No
definite skull base defect.

Orbits: Unremarkable.

Sinuses: Near total opacification left maxillary sinus with lobular
tissue extending into the nasal cavity and nasopharynx. Left
ostiomeatal unit is occluded. Mild right maxillary sinus mucosal
thickening. Occluded right infundibulum. Patchy ethmoid mucosal
thickening. Opacified frontal recesses. Minimal sphenoid sinus
retained secretions. Sphenoethmoidal recesses are patent. Left
superior meatus is occluded by above lobular tissue.

Soft tissues: Unremarkable.

Limited intracranial: Unremarkable.
IMPRESSION: No definite defect of the cribriform plate or ethmoid roofs.

Left antrochoanal polyp.

## 2022-09-29 DIAGNOSIS — N943 Premenstrual tension syndrome: Secondary | ICD-10-CM | POA: Diagnosis not present

## 2022-09-30 ENCOUNTER — Ambulatory Visit: Payer: 59 | Admitting: Podiatry

## 2022-09-30 DIAGNOSIS — B070 Plantar wart: Secondary | ICD-10-CM

## 2022-09-30 NOTE — Progress Notes (Signed)
Subjective:   Patient ID: Chloe Allen, female   DOB: 36 y.o.   MRN: [id number]   HPI Chief Complaint  Patient presents with   Callouses    Patient came in today for 2x possible plantar wart right foot,  started in June, patient is having some pain, shooting sharp pain, patient would like to have them removed,     36 year old female presents the office today with above concerns.  This started in June. She was using OTC corn removers on the lesions but it did not help. She states it hurts with walking/pressure. When she takes the pressure off she gets shooting pain. No injuries.    Review of Systems  All other systems reviewed and are negative.  No past medical history on file.  No past surgical history on file.   Current Outpatient Medications:    diclofenac Sodium (VOLTAREN) 1 % GEL, Apply 2 g topically 4 (four) times daily. Rub into affected area of foot 2 to 4 times daily, Disp: 100 g, Rfl: 2   Cholecalciferol (VITAMIN D3) 25 MCG (1000 UT) CAPS, Take 1 capsule (1,000 Units total) by mouth daily. (Patient not taking: Reported on 09/30/2022), Disp: 90 capsule, Rfl: 0  Allergies  Allergen Reactions   Kiwi Extract    Latex    Pineapple           Objective:  Physical Exam  General: AAO x3, NAD  Dermatological: 2 areas of hyperkeratotic lesion, verruca is present.  1 is in the right heel as well as the distal arch.  There is minimal pinpoint bleeding with evidence of verruca.  There is no surrounding edema, erythema, drainage or pus or any signs of infection.  There is no open lesions.  Vascular: Dorsalis Pedis artery and Posterior Tibial artery pedal pulses are 2/4 bilateral with immedate capillary fill time. There is no pain with calf compression, swelling, warmth, erythema.   Neruologic: Grossly intact via light touch bilateral.   Musculoskeletal: Tenderness the skin lesions but no other areas of discomfort.  Gait: Unassisted, Nonantalgic.        Assessment:    Plantar wart     Plan:  -Treatment options discussed including all alternatives, risks, and complications -Etiology of symptoms were discussed -Sharply debrided the lesion today without any complications or bleeding.  Cantharone was applied followed by an occlusive bandage.  Postprocedure instructions discussed.  Tolerated the procedure well. -if not improvement she is going to contact us and she will contact the office and I can order topical medication to apply to the skin lesions.   Vivi Barrack DPM

## 2022-09-30 NOTE — Patient Instructions (Signed)
Take dressing off in 8 hours and wash the foot with soap and water. If it is hurting or becomes uncomfortable before the 8 hours, go ahead and remove the bandage and wash the area.  If it blisters, apply antibiotic ointment and a band-aid.  Monitor for any signs/symptoms of infection. Call the office immediately if any occur or go directly to the emergency room. Call with any questions/concerns.   

## 2022-10-01 ENCOUNTER — Encounter: Payer: Self-pay | Admitting: Podiatry

## 2022-10-01 ENCOUNTER — Other Ambulatory Visit: Payer: Self-pay | Admitting: Podiatry

## 2022-10-01 MED ORDER — DICLOFENAC SODIUM 1 % EX GEL: 2.0000 g | Freq: Four times a day (QID) | CUTANEOUS | 2 refills | Status: DC

## 2022-10-13 ENCOUNTER — Ambulatory Visit (HOSPITAL_BASED_OUTPATIENT_CLINIC_OR_DEPARTMENT_OTHER): Payer: 59 | Admitting: Family Medicine

## 2022-11-11 ENCOUNTER — Ambulatory Visit: Payer: Self-pay | Admitting: Podiatry

## 2022-11-18 ENCOUNTER — Encounter (HOSPITAL_BASED_OUTPATIENT_CLINIC_OR_DEPARTMENT_OTHER): Payer: Self-pay | Admitting: Family Medicine

## 2022-11-18 ENCOUNTER — Ambulatory Visit (INDEPENDENT_AMBULATORY_CARE_PROVIDER_SITE_OTHER): Payer: Commercial Managed Care - PPO | Admitting: Family Medicine

## 2022-11-18 VITALS — BP 128/84 | HR 68 | Ht 64.0 in | Wt 192.5 lb

## 2022-11-18 DIAGNOSIS — Z0000 Encounter for general adult medical examination without abnormal findings: Secondary | ICD-10-CM | POA: Diagnosis not present

## 2022-11-18 DIAGNOSIS — Z23 Encounter for immunization: Secondary | ICD-10-CM

## 2022-11-18 DIAGNOSIS — E559 Vitamin D deficiency, unspecified: Secondary | ICD-10-CM

## 2022-11-18 NOTE — Progress Notes (Signed)
Subjective:    CC: Annual Physical Exam  HPI:  Chloe Allen is a 37 y.o. presenting for annual physical  I reviewed the past medical history, family history, social history, surgical history, and allergies today and no changes were needed.  Please see the problem list section below in epic for further details.  Past Medical History: History reviewed. No pertinent past medical history. Past Surgical History: History reviewed. No pertinent surgical history. Social History: Social History   Socioeconomic History   Marital status: Unknown    Spouse name: Not on file   Number of children: Not on file   Years of education: Not on file   Highest education level: Not on file  Occupational History   Not on file  Tobacco Use   Smoking status: Never   Smokeless tobacco: Never  Vaping Use   Vaping Use: Never used  Substance and Sexual Activity   Alcohol use: Yes    Comment: occasionally   Drug use: Never   Sexual activity: Yes  Other Topics Concern   Not on file  Social History Narrative   Not on file   Social Determinants of Health   Financial Resource Strain: Not on file  Food Insecurity: Not on file  Transportation Needs: Not on file  Physical Activity: Not on file  Stress: Not on file  Social Connections: Not on file   Family History: Family History  Problem Relation Age of Onset   Hypertension Mother    Hyperlipidemia Father    Hyperlipidemia Maternal Grandmother    Hypertension Maternal Grandmother    Diabetes Maternal Grandmother    Hypertension Maternal Grandfather    Hyperlipidemia Maternal Grandfather    Diabetes Maternal Grandfather    Hypertension Paternal Grandmother    Hyperlipidemia Paternal Grandmother    Diabetes Paternal Grandmother    Hypertension Paternal Grandfather    Hyperlipidemia Paternal Grandfather    Diabetes Paternal Grandfather    Allergies: Allergies  Allergen Reactions   Kiwi Extract    Latex    Pineapple    Medications:  See med rec.  Review of Systems: No headache, visual changes, nausea, vomiting, diarrhea, constipation, dizziness, abdominal pain, skin rash, fevers, chills, night sweats, swollen lymph nodes, weight loss, chest pain, body aches, joint swelling, muscle aches, shortness of breath, mood changes, visual or auditory hallucinations.  Objective:    BP 128/84 (BP Location: Right Arm, Patient Position: Sitting, Cuff Size: Large)   Pulse 68   Ht 5\' 4"  (1.626 m)   Wt 192 lb 8 oz (87.3 kg)   SpO2 100%   BMI 33.04 kg/m   General: Well Developed, well nourished, and in no acute distress. Neuro: Alert and oriented x3, extra-ocular muscles intact, sensation grossly intact. Cranial nerves II through XII are intact, motor, sensory, and coordinative functions are all intact. HEENT: Normocephalic, atraumatic, pupils equal round reactive to light, neck supple, no masses, no lymphadenopathy, thyroid nonpalpable. Oropharynx, nasopharynx, external ear canals are unremarkable. Skin: Warm and dry, no rashes noted. Cardiac: Regular rate and rhythm, no murmurs rubs or gallops. Respiratory: Clear to auscultation bilaterally. Not using accessory muscles, speaking in full sentences. Abdominal: Soft, nontender, nondistended, positive bowel sounds, no masses, no organomegaly. Musculoskeletal: Shoulder, elbow, wrist, hip, knee, ankle stable, and with full range of motion.  Impression and Recommendations:    Wellness examination Routine HCM labs ordered. HCM reviewed/discussed. Anticipatory guidance regarding healthy weight, lifestyle and choices given. Recommend healthy diet.  Recommend approximately 150 minutes/week of moderate intensity exercise Recommend  regular dental and vision exams Always use seatbelt/lap and shoulder restraints Recommend using smoke alarms and checking batteries at least twice a year Recommend using sunscreen when outside Discussed tetanus immunization recommendations, patient agreed to  proceed with this today  Vitamin D deficiency She did have mild vitamin D deficiency noted on prior labs.  She was utilizing vitamin D supplement, however has not been taking it regularly recently.  We will proceed with recheck of vitamin D level with upcoming labs and plan to replete as indicated  Patient is requesting referral to dermatologist.  We discussed options including dermatology offices locally as well as waiting until dermatologist begins seeing patients here at [facility].  She would prefer to hold off until dermatology office here at [facility] is open and accepting referrals  Return in about 1 year (around 11/19/2023) for CPE.   ___________________________________________ Cintia Gleed de [place], MD, ABFM, CAQSM Primary Care and [place]

## 2022-11-18 NOTE — Assessment & Plan Note (Signed)
Routine HCM labs ordered. HCM reviewed/discussed. Anticipatory guidance regarding healthy weight, lifestyle and choices given. Recommend healthy diet.  Recommend approximately 150 minutes/week of moderate intensity exercise Recommend regular dental and vision exams Always use seatbelt/lap and shoulder restraints Recommend using smoke alarms and checking batteries at least twice a year Recommend using sunscreen when outside Discussed tetanus immunization recommendations, patient agreed to proceed with this today 

## 2022-11-18 NOTE — Patient Instructions (Signed)
  Medication Instructions:  Your physician recommends that you continue on your current medications as directed. Please refer to the Current Medication list given to you today. --If you need a refill on any your medications before your next appointment, please call your pharmacy first. If no refills are authorized on file call the office.-- Lab Work: Your physician has recommended that you have lab work today: No If you have labs (blood work) drawn today and your tests are completely normal, you will receive your results via MyChart message OR a phone call from our staff.  Please ensure you check your voicemail in the event that you authorized detailed messages to be left on a delegated number. If you have any lab test that is abnormal or we need to change your treatment, we will call you to review the results.  Referrals/Procedures/Imaging: No  Follow-Up: Your next appointment:   Your physician recommends that you schedule a follow-up appointment in: 1 year cpe with Dr. de [place].  You will receive a text message or e-mail with a link to a survey about your care and experience with us today! We would greatly appreciate your feedback!   Thanks for letting us be apart of your health journey!!  Primary Care and Sports Medicine   Dr. Raymond de [place]   We encourage you to activate your patient portal called "MyChart".  Sign up information is provided on this After Visit Summary.  MyChart is used to connect with patients for Virtual Visits (Telemedicine).  Patients are able to view lab/test results, encounter notes, upcoming appointments, etc.  Non-urgent messages can be sent to your provider as well. To learn more about what you can do with MyChart, please visit --  [email or web address].    

## 2022-11-18 NOTE — Assessment & Plan Note (Signed)
She did have mild vitamin D deficiency noted on prior labs.  She was utilizing vitamin D supplement, however has not been taking it regularly recently.  We will proceed with recheck of vitamin D level with upcoming labs and plan to replete as indicated

## 2022-11-24 ENCOUNTER — Encounter: Payer: Self-pay | Admitting: Podiatry

## 2022-11-24 ENCOUNTER — Ambulatory Visit: Payer: Commercial Managed Care - PPO | Admitting: Podiatry

## 2022-11-24 DIAGNOSIS — L600 Ingrowing nail: Secondary | ICD-10-CM

## 2022-11-24 NOTE — Progress Notes (Signed)
Subjective:   Patient ID: Chloe Allen, female   DOB: 37 y.o.   MRN: [id number]   HPI Patient concerned about discoloration in his right big toe and states that partial that was coming off he wanted it checked   ROS      Objective:  Physical Exam  Neuro vascular status intact with patient found to have a split right big toenail that does not have pain currently     Assessment:  Traumatized right hallux nail localized to this area     Plan:  H&P reviewed smoothed out the split and explained it is going to grow out ultimately may need to be removed but we like to not have to do that unless it becomes a problem.  All questions answered today

## 2022-12-24 ENCOUNTER — Other Ambulatory Visit (HOSPITAL_BASED_OUTPATIENT_CLINIC_OR_DEPARTMENT_OTHER): Payer: Self-pay

## 2022-12-24 MED ORDER — DULOXETINE HCL 30 MG PO CPEP
30.0000 mg | ORAL_CAPSULE | Freq: Every morning | ORAL | 1 refills | Status: DC
  Filled 2022-12-24: qty 30, 30d supply, fill #0

## 2023-01-10 ENCOUNTER — Other Ambulatory Visit (HOSPITAL_BASED_OUTPATIENT_CLINIC_OR_DEPARTMENT_OTHER): Payer: Self-pay

## 2023-01-10 DIAGNOSIS — N943 Premenstrual tension syndrome: Secondary | ICD-10-CM | POA: Diagnosis not present

## 2023-01-10 MED ORDER — DULOXETINE HCL 30 MG PO CPEP
30.0000 mg | ORAL_CAPSULE | Freq: Every morning | ORAL | 2 refills | Status: DC
  Filled 2023-01-10 – 2023-02-03 (×3): qty 30, 30d supply, fill #0
  Filled 2023-03-07: qty 30, 30d supply, fill #1
  Filled 2023-04-07: qty 30, 30d supply, fill #2

## 2023-01-17 ENCOUNTER — Other Ambulatory Visit (HOSPITAL_BASED_OUTPATIENT_CLINIC_OR_DEPARTMENT_OTHER): Payer: Self-pay

## 2023-01-24 ENCOUNTER — Other Ambulatory Visit (HOSPITAL_BASED_OUTPATIENT_CLINIC_OR_DEPARTMENT_OTHER): Payer: Self-pay

## 2023-02-03 ENCOUNTER — Other Ambulatory Visit (HOSPITAL_BASED_OUTPATIENT_CLINIC_OR_DEPARTMENT_OTHER): Payer: Self-pay

## 2023-02-04 ENCOUNTER — Other Ambulatory Visit (HOSPITAL_BASED_OUTPATIENT_CLINIC_OR_DEPARTMENT_OTHER): Payer: Self-pay

## 2023-03-07 ENCOUNTER — Other Ambulatory Visit (HOSPITAL_BASED_OUTPATIENT_CLINIC_OR_DEPARTMENT_OTHER): Payer: Self-pay

## 2023-04-07 ENCOUNTER — Other Ambulatory Visit (HOSPITAL_BASED_OUTPATIENT_CLINIC_OR_DEPARTMENT_OTHER): Payer: Self-pay

## 2023-05-02 ENCOUNTER — Other Ambulatory Visit (HOSPITAL_BASED_OUTPATIENT_CLINIC_OR_DEPARTMENT_OTHER): Payer: Self-pay

## 2023-05-02 ENCOUNTER — Other Ambulatory Visit: Payer: Self-pay

## 2023-05-02 DIAGNOSIS — N943 Premenstrual tension syndrome: Secondary | ICD-10-CM | POA: Diagnosis not present

## 2023-05-02 MED ORDER — DULOXETINE HCL 30 MG PO CPEP
30.0000 mg | ORAL_CAPSULE | Freq: Every morning | ORAL | 0 refills | Status: DC
  Filled 2023-05-02: qty 90, 90d supply, fill #0

## 2023-05-03 ENCOUNTER — Other Ambulatory Visit (HOSPITAL_BASED_OUTPATIENT_CLINIC_OR_DEPARTMENT_OTHER): Payer: Self-pay

## 2023-08-02 ENCOUNTER — Other Ambulatory Visit (HOSPITAL_BASED_OUTPATIENT_CLINIC_OR_DEPARTMENT_OTHER): Payer: Self-pay

## 2023-08-02 DIAGNOSIS — N943 Premenstrual tension syndrome: Secondary | ICD-10-CM | POA: Diagnosis not present

## 2023-08-02 MED ORDER — BUSPIRONE HCL 10 MG PO TABS
10.0000 mg | ORAL_TABLET | Freq: Two times a day (BID) | ORAL | 1 refills | Status: DC
  Filled 2023-08-02: qty 60, 30d supply, fill #0
  Filled 2023-10-19: qty 60, 30d supply, fill #1

## 2023-08-02 MED ORDER — DULOXETINE HCL 60 MG PO CPEP
60.0000 mg | ORAL_CAPSULE | Freq: Every morning | ORAL | 0 refills | Status: AC
  Filled 2023-08-02: qty 90, 90d supply, fill #0

## 2023-08-06 ENCOUNTER — Other Ambulatory Visit (HOSPITAL_BASED_OUTPATIENT_CLINIC_OR_DEPARTMENT_OTHER): Payer: Self-pay

## 2023-09-01 ENCOUNTER — Other Ambulatory Visit: Payer: Self-pay

## 2023-10-19 ENCOUNTER — Other Ambulatory Visit (HOSPITAL_BASED_OUTPATIENT_CLINIC_OR_DEPARTMENT_OTHER): Payer: Self-pay

## 2023-11-14 ENCOUNTER — Other Ambulatory Visit (HOSPITAL_BASED_OUTPATIENT_CLINIC_OR_DEPARTMENT_OTHER): Payer: Self-pay

## 2023-11-14 MED ORDER — HYDROXYZINE HCL 25 MG PO TABS
25.0000 mg | ORAL_TABLET | Freq: Every day | ORAL | 0 refills | Status: AC
  Filled 2023-11-14 – 2024-03-07 (×2): qty 60, 30d supply, fill #0

## 2023-11-14 MED ORDER — DULOXETINE HCL 60 MG PO CPEP
60.0000 mg | ORAL_CAPSULE | Freq: Every morning | ORAL | 0 refills | Status: AC
  Filled 2023-11-14 – 2023-12-10 (×2): qty 90, 90d supply, fill #0

## 2023-11-14 MED ORDER — BUSPIRONE HCL 10 MG PO TABS
10.0000 mg | ORAL_TABLET | Freq: Two times a day (BID) | ORAL | 0 refills | Status: AC
  Filled 2023-11-14 – 2023-12-10 (×2): qty 60, 30d supply, fill #0

## 2023-11-21 ENCOUNTER — Encounter (HOSPITAL_BASED_OUTPATIENT_CLINIC_OR_DEPARTMENT_OTHER): Payer: Commercial Managed Care - PPO | Admitting: Family Medicine

## 2023-11-24 ENCOUNTER — Other Ambulatory Visit (HOSPITAL_BASED_OUTPATIENT_CLINIC_OR_DEPARTMENT_OTHER): Payer: Self-pay

## 2023-11-24 DIAGNOSIS — N943 Premenstrual tension syndrome: Secondary | ICD-10-CM | POA: Diagnosis not present

## 2023-11-24 DIAGNOSIS — F411 Generalized anxiety disorder: Secondary | ICD-10-CM | POA: Diagnosis not present

## 2023-11-24 MED ORDER — DULOXETINE HCL 60 MG PO CPEP
60.0000 mg | ORAL_CAPSULE | Freq: Every morning | ORAL | 2 refills | Status: DC
  Filled 2023-11-24 – 2024-03-07 (×2): qty 30, 30d supply, fill #0
  Filled 2024-04-27: qty 30, 30d supply, fill #1
  Filled 2024-06-01: qty 30, 30d supply, fill #2

## 2023-11-24 MED ORDER — HYDROXYZINE HCL 25 MG PO TABS
25.0000 mg | ORAL_TABLET | Freq: Every day | ORAL | 2 refills | Status: DC
  Filled 2023-11-24 – 2023-12-10 (×2): qty 60, 60d supply, fill #0

## 2023-11-24 MED ORDER — BUSPIRONE HCL 10 MG PO TABS
10.0000 mg | ORAL_TABLET | Freq: Two times a day (BID) | ORAL | 2 refills | Status: DC
  Filled 2023-11-24 – 2024-03-07 (×2): qty 60, 30d supply, fill #0
  Filled 2024-04-27: qty 60, 30d supply, fill #1

## 2023-12-10 ENCOUNTER — Other Ambulatory Visit (HOSPITAL_BASED_OUTPATIENT_CLINIC_OR_DEPARTMENT_OTHER): Payer: Self-pay

## 2024-01-05 ENCOUNTER — Encounter (HOSPITAL_BASED_OUTPATIENT_CLINIC_OR_DEPARTMENT_OTHER): Payer: Self-pay | Admitting: *Deleted

## 2024-03-07 ENCOUNTER — Other Ambulatory Visit (HOSPITAL_BASED_OUTPATIENT_CLINIC_OR_DEPARTMENT_OTHER): Payer: Self-pay

## 2024-04-13 ENCOUNTER — Ambulatory Visit (INDEPENDENT_AMBULATORY_CARE_PROVIDER_SITE_OTHER): Admitting: Licensed Clinical Social Worker

## 2024-04-13 DIAGNOSIS — F331 Major depressive disorder, recurrent, moderate: Secondary | ICD-10-CM | POA: Diagnosis not present
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
## 2024-04-15 DIAGNOSIS — F331 Major depressive disorder, recurrent, moderate: Secondary | ICD-10-CM | POA: Insufficient documentation

## 2024-04-27 ENCOUNTER — Other Ambulatory Visit (HOSPITAL_BASED_OUTPATIENT_CLINIC_OR_DEPARTMENT_OTHER): Payer: Self-pay

## 2024-04-27 ENCOUNTER — Ambulatory Visit (INDEPENDENT_AMBULATORY_CARE_PROVIDER_SITE_OTHER): Admitting: Licensed Clinical Social Worker

## 2024-04-27 DIAGNOSIS — F331 Major depressive disorder, recurrent, moderate: Secondary | ICD-10-CM | POA: Diagnosis not present
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
## 2024-04-30 ENCOUNTER — Other Ambulatory Visit (HOSPITAL_BASED_OUTPATIENT_CLINIC_OR_DEPARTMENT_OTHER): Payer: Self-pay

## 2024-05-11 ENCOUNTER — Ambulatory Visit (INDEPENDENT_AMBULATORY_CARE_PROVIDER_SITE_OTHER): Admitting: Licensed Clinical Social Worker

## 2024-05-11 DIAGNOSIS — F331 Major depressive disorder, recurrent, moderate: Secondary | ICD-10-CM | POA: Diagnosis not present
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
## 2024-05-14 ENCOUNTER — Telehealth (HOSPITAL_BASED_OUTPATIENT_CLINIC_OR_DEPARTMENT_OTHER): Payer: Self-pay | Admitting: *Deleted

## 2024-05-14 DIAGNOSIS — Z0000 Encounter for general adult medical examination without abnormal findings: Secondary | ICD-10-CM

## 2024-05-14 DIAGNOSIS — E559 Vitamin D deficiency, unspecified: Secondary | ICD-10-CM

## 2024-05-14 NOTE — Addendum Note (Signed)
 Addended by: DE PERU, Boneta Standre J on: 05/14/2024 10:52 PM   Modules accepted: Orders

## 2024-05-14 NOTE — Telephone Encounter (Signed)
 Please advise if you would like to place orders for patient

## 2024-05-14 NOTE — Telephone Encounter (Signed)
 Copied from CRM [id number]. Topic: Clinical - Request for Lab/Test Order >> May 14, 2024  8:31 AM Harlene ORN wrote: Reason for CRM: Requesting new lab orders with ability to be seen at any [facility].

## 2024-05-17 ENCOUNTER — Other Ambulatory Visit (HOSPITAL_BASED_OUTPATIENT_CLINIC_OR_DEPARTMENT_OTHER): Payer: Self-pay | Admitting: *Deleted

## 2024-05-17 DIAGNOSIS — E559 Vitamin D deficiency, unspecified: Secondary | ICD-10-CM

## 2024-05-17 DIAGNOSIS — Z0000 Encounter for general adult medical examination without abnormal findings: Secondary | ICD-10-CM

## 2024-05-18 LAB — COMPREHENSIVE METABOLIC PANEL WITH GFR
ALT: 16 IU/L (ref 0–32)
AST: 18 IU/L (ref 0–40)
Albumin: 4.1 g/dL (ref 3.9–4.9)
Alkaline Phosphatase: 92 IU/L (ref 44–121)
BUN/Creatinine Ratio: 13 (ref 9–23)
BUN: 9 mg/dL (ref 6–20)
Bilirubin Total: 0.3 mg/dL (ref 0.0–1.2)
CO2: 18 mmol/L — ABNORMAL LOW (ref 20–29)
Calcium: 8.8 mg/dL (ref 8.7–10.2)
Chloride: 106 mmol/L (ref 96–106)
Creatinine, Ser: 0.72 mg/dL (ref 0.57–1.00)
Globulin, Total: 2.2 g/dL (ref 1.5–4.5)
Glucose: 84 mg/dL (ref 70–99)
Potassium: 4.2 mmol/L (ref 3.5–5.2)
Sodium: 140 mmol/L (ref 134–144)
Total Protein: 6.3 g/dL (ref 6.0–8.5)
eGFR: 110 mL/min/1.73 (ref >59)

## 2024-05-18 LAB — CBC WITH DIFFERENTIAL/PLATELET
Basophils Absolute: 0.1 x10E3/uL (ref 0.0–0.2)
Basos: 1 %
EOS (ABSOLUTE): 0.1 x10E3/uL (ref 0.0–0.4)
Eos: 1 %
Hematocrit: 35 % (ref 34.0–46.6)
Hemoglobin: 10.3 g/dL — ABNORMAL LOW (ref 11.1–15.9)
Immature Grans (Abs): 0 x10E3/uL (ref 0.0–0.1)
Immature Granulocytes: 0 %
Lymphocytes Absolute: 2.9 x10E3/uL (ref 0.7–3.1)
Lymphs: 42 %
MCH: 24.5 pg — ABNORMAL LOW (ref 26.6–33.0)
MCHC: 29.4 g/dL — ABNORMAL LOW (ref 31.5–35.7)
MCV: 83 fL (ref 79–97)
Monocytes Absolute: 0.4 x10E3/uL (ref 0.1–0.9)
Monocytes: 6 %
Neutrophils Absolute: 3.5 x10E3/uL (ref 1.4–7.0)
Neutrophils: 50 %
Platelets: 246 x10E3/uL (ref 150–450)
RBC: 4.2 x10E6/uL (ref 3.77–5.28)
RDW: 14.6 % (ref 11.7–15.4)
WBC: 7 x10E3/uL (ref 3.4–10.8)

## 2024-05-18 LAB — LIPID PANEL
Chol/HDL Ratio: 3.4 ratio (ref 0.0–4.4)
Cholesterol, Total: 182 mg/dL (ref 100–199)
HDL: 54 mg/dL (ref >39)
LDL Chol Calc (NIH): 117 mg/dL — ABNORMAL HIGH (ref 0–99)
Triglycerides: 59 mg/dL (ref 0–149)
VLDL Cholesterol Cal: 11 mg/dL (ref 5–40)

## 2024-05-18 LAB — VITAMIN D 25 HYDROXY (VIT D DEFICIENCY, FRACTURES): Vit D, 25-Hydroxy: 26.8 ng/mL — ABNORMAL LOW (ref 30.0–100.0)

## 2024-05-18 LAB — VITAMIN B12: Vitamin B-12: 710 pg/mL (ref 232–1245)

## 2024-05-18 LAB — HEMOGLOBIN A1C
Est. average glucose Bld gHb Est-mCnc: 103 mg/dL
Hgb A1c MFr Bld: 5.2 % (ref 4.8–5.6)

## 2024-05-18 LAB — TSH RFX ON ABNORMAL TO FREE T4: TSH: 1.84 u[IU]/mL (ref 0.450–4.500)

## 2024-05-25 ENCOUNTER — Ambulatory Visit: Admitting: Licensed Clinical Social Worker

## 2024-05-25 NOTE — Progress Notes (Unsigned)
 [facility] Counselor/Therapist Progress Note  Patient ID: Chloe Allen, MRN: [id number]    Date: 05/25/24  Time Spent: ***  {LBBHAMPM:26719} - *** {LBBHAMPM:26719} : *** Minutes  Treatment Type: Individual Therapy.  Reported Symptoms: ***  Mental Status Exam: Appearance:  {PSY:22683}     Behavior: {PSY:21022743}  Motor: {PSY:22302}  Speech/Language:  {PSY:22685}  Affect: {PSY:22687}  Mood: {PSY:31886}  Thought process: {PSY:31888}  Thought content:   {PSY:229-513-0474}  Sensory/Perceptual disturbances:   {PSY:281-325-7705}  Orientation: {PSY:30297}  Attention: {PSY:22877}  Concentration: {PSY:6056133527}  Memory: {[phone]}  Fund of knowledge:  {PSY:6056133527}  Insight:   {PSY:6056133527}  Judgment:  {PSY:6056133527}  Impulse Control: {PSY:6056133527}   Risk Assessment: Danger to Self:  {PSY:22692} Self-injurious Behavior: {PSY:22692} Danger to Others: {PSY:22692} Duty to Warn:{PSY:311194} Physical Aggression / Violence:{PSY:21197} Access to Firearms a concern: {PSY:21197} Gang Involvement:{PSY:21197}  Subjective:   Chloe Allen participated from {Patient Location:[address]::home}, via {LBBHVIDEOORPHONE:26720}, and consented to treatment. Therapist participated from {LBBHPROVIDERLOCATION:26721}. We met online due to COVID pandemic.   ***   Interventions: {PSY:603-089-1783}  Diagnosis: No diagnosis found.   Plan: ***Patient is to use CBT, mindfulness and coping skills to help manage decrease symptoms associated with their diagnosis.   Long-term goal:   ***Reduce overall level, frequency, and intensity of the feelings of depression, anxiety and panic evidenced by       decreased irritability, negative self talk, and helpless feelings from 6 to 7 days/week to 0 to 1 days/week per client report for at least 3 consecutive months.  Short-term goal:  ***Verbally express understanding of the relationship between feelings of depression, anxiety and  their impact on thinking patterns and behaviors. Verbalize an understanding of the role that distorted thinking plays in creating fears, excessive worry, and ruminations.  Damien Junk MSW, LCSW/DATE

## 2024-05-29 ENCOUNTER — Other Ambulatory Visit (HOSPITAL_BASED_OUTPATIENT_CLINIC_OR_DEPARTMENT_OTHER): Payer: Self-pay

## 2024-05-29 ENCOUNTER — Encounter (HOSPITAL_BASED_OUTPATIENT_CLINIC_OR_DEPARTMENT_OTHER): Payer: Self-pay | Admitting: Family Medicine

## 2024-05-29 ENCOUNTER — Ambulatory Visit (INDEPENDENT_AMBULATORY_CARE_PROVIDER_SITE_OTHER): Admitting: Family Medicine

## 2024-05-29 VITALS — BP 108/74 | HR 85 | Ht 64.0 in | Wt 200.8 lb

## 2024-05-29 DIAGNOSIS — E559 Vitamin D deficiency, unspecified: Secondary | ICD-10-CM

## 2024-05-29 DIAGNOSIS — E66811 Obesity, class 1: Secondary | ICD-10-CM | POA: Diagnosis not present

## 2024-05-29 DIAGNOSIS — N920 Excessive and frequent menstruation with regular cycle: Secondary | ICD-10-CM | POA: Insufficient documentation

## 2024-05-29 DIAGNOSIS — L989 Disorder of the skin and subcutaneous tissue, unspecified: Secondary | ICD-10-CM | POA: Diagnosis not present

## 2024-05-29 DIAGNOSIS — J302 Other seasonal allergic rhinitis: Secondary | ICD-10-CM | POA: Insufficient documentation

## 2024-05-29 MED ORDER — WEGOVY 0.25 MG/0.5ML ~~LOC~~ SOAJ
0.2500 mg | SUBCUTANEOUS | 1 refills | Status: DC
Start: 2024-05-29 — End: 2024-07-10
  Filled 2024-05-29 – 2024-06-11 (×3): qty 2, 28d supply, fill #0

## 2024-05-29 NOTE — Progress Notes (Signed)
 Procedures performed today:    None.  Independent interpretation of notes and tests performed by another provider:   None.  Brief History, Exam, Impression, and Recommendations:    BP 108/74 (BP Location: Left Arm, Patient Position: Sitting, Cuff Size: Normal)   Pulse 85   Ht 5' 4 (1.626 m)   Wt 200 lb 12.8 oz (91.1 kg)   SpO2 100%   BMI 34.47 kg/m   Obesity, Class I, BMI 30-34.9 Assessment & Plan: Patient reports that she has tried for a number of years to focus on lifestyle modifications to assist with weight loss.  She has made dietary changes, she has also included regular exercise into her routine.  She has not tried any medications in the past.  Given difficulty that she has had with trying to lose weight with conservative measures alone, she presents today primarily to discuss options related to medications to assist with weight loss. Long discussion today reviewing weight loss medications including injectable options including GLP-1 receptor agonist, oral agents including Contrave, orlistat, phentermine.  We discussed potential risk, benefits, cost associated with these various medications as well as the possibility of insurance coverage or lack thereof.  We discussed typical dosing regimen for both injectable and oral medications, proper administration of each.  We discussed potential outcomes with each. After discussion of potential risks and adverse reactions with these medications and potential benefits of each, patient would like to proceed with injectable GLP-1 receptor agonist if possible.  Prescription sent to pharmacy on file, if medication is cost prohibitive for patient, he will let us  know and we can look to send an alternative option.  Will plan for close follow-up to monitor response to whichever medication patient is able to initiate. Additional consideration is for referral to healthy weight and wellness clinic We also discussed new indication for Zepbound in  management of sleep apnea.  She does report being told that she snores at night.  Also has intermittent daytime fatigue, lack of restful sleep.  She does note that she often times feels as though she could fall asleep easily during the day.  If having difficulty with obtaining authorization for GLP-1 receptor agonist, may consider further evaluation of potential underlying sleep apnea  Orders: -     Wegovy ; Inject 0.25 mg into the skin once a week.  Dispense: 2 mL; Refill: 1  Menorrhagia with regular cycle Assessment & Plan: On recent labs, patient did have normocytic anemia.  This was new compared to prior labs from a couple years ago.  She does have history of irregular menstrual cycles, however reports that they have been regular but with heavier bleeding.  Suspect this is likely cause for observed mild anemia.  She would like to establish with OB/GYN, would prefer to establish here, referral placed today.  Orders: -     Ambulatory referral to Obstetrics / Gynecology  Skin lesion Assessment & Plan: Reports that she has a mole that she is concerned about and does not want to establish with dermatology for further evaluation.  Referral placed today.  Orders: -     Ambulatory referral to Dermatology  Vitamin D  deficiency Assessment & Plan: Vitamin D  on recent labs still with mild deficiency, has improved from last labs.  Not currently utilizing vitamin D  supplementation.  Given degree of mild deficiency, would be reasonable to resume low-dose of vitamin D  supplement, recommend 1 to 2000 international units daily, this can be obtained over-the-counter.  Would plan to recheck this  again in about 3 to 4 months after starting supplementation   Return in about 4 weeks (around 06/26/2024) for med check, can be virtual.   ___________________________________________ Khari Mally de [place], MD, ABFM, CAQSM Primary Care and [facility] Medicine [facility]

## 2024-05-29 NOTE — Assessment & Plan Note (Signed)
 On recent labs, patient did have normocytic anemia.  This was new compared to prior labs from a couple years ago.  She does have history of irregular menstrual cycles, however reports that they have been regular but with heavier bleeding.  Suspect this is likely cause for observed mild anemia.  She would like to establish with OB/GYN, would prefer to establish here, referral placed today.

## 2024-05-29 NOTE — Assessment & Plan Note (Signed)
 Patient reports that she has tried for a number of years to focus on lifestyle modifications to assist with weight loss.  She has made dietary changes, she has also included regular exercise into her routine.  She has not tried any medications in the past.  Given difficulty that she has had with trying to lose weight with conservative measures alone, she presents today primarily to discuss options related to medications to assist with weight loss. Long discussion today reviewing weight loss medications including injectable options including GLP-1 receptor agonist, oral agents including Contrave, orlistat, phentermine.  We discussed potential risk, benefits, cost associated with these various medications as well as the possibility of insurance coverage or lack thereof.  We discussed typical dosing regimen for both injectable and oral medications, proper administration of each.  We discussed potential outcomes with each. After discussion of potential risks and adverse reactions with these medications and potential benefits of each, patient would like to proceed with injectable GLP-1 receptor agonist if possible.  Prescription sent to pharmacy on file, if medication is cost prohibitive for patient, he will let us  know and we can look to send an alternative option.  Will plan for close follow-up to monitor response to whichever medication patient is able to initiate. Additional consideration is for referral to healthy weight and wellness clinic We also discussed new indication for Zepbound in management of sleep apnea.  She does report being told that she snores at night.  Also has intermittent daytime fatigue, lack of restful sleep.  She does note that she often times feels as though she could fall asleep easily during the day.  If having difficulty with obtaining authorization for GLP-1 receptor agonist, may consider further evaluation of potential underlying sleep apnea

## 2024-05-29 NOTE — Assessment & Plan Note (Signed)
 Vitamin D  on recent labs still with mild deficiency, has improved from last labs.  Not currently utilizing vitamin D  supplementation.  Given degree of mild deficiency, would be reasonable to resume low-dose of vitamin D  supplement, recommend 1 to 2000 international units daily, this can be obtained over-the-counter.  Would plan to recheck this again in about 3 to 4 months after starting supplementation

## 2024-05-29 NOTE — Assessment & Plan Note (Signed)
 Reports that she has a mole that she is concerned about and does not want to establish with dermatology for further evaluation.  Referral placed today.

## 2024-06-01 ENCOUNTER — Other Ambulatory Visit (HOSPITAL_BASED_OUTPATIENT_CLINIC_OR_DEPARTMENT_OTHER): Payer: Self-pay

## 2024-06-04 ENCOUNTER — Other Ambulatory Visit (HOSPITAL_BASED_OUTPATIENT_CLINIC_OR_DEPARTMENT_OTHER): Payer: Self-pay

## 2024-06-04 MED ORDER — BUSPIRONE HCL 10 MG PO TABS
10.0000 mg | ORAL_TABLET | Freq: Two times a day (BID) | ORAL | 0 refills | Status: DC
  Filled 2024-06-04: qty 60, 30d supply, fill #0

## 2024-06-11 ENCOUNTER — Other Ambulatory Visit (HOSPITAL_BASED_OUTPATIENT_CLINIC_OR_DEPARTMENT_OTHER): Payer: Self-pay

## 2024-06-11 ENCOUNTER — Encounter (HOSPITAL_BASED_OUTPATIENT_CLINIC_OR_DEPARTMENT_OTHER): Payer: Self-pay | Admitting: Family Medicine

## 2024-07-03 ENCOUNTER — Other Ambulatory Visit (HOSPITAL_BASED_OUTPATIENT_CLINIC_OR_DEPARTMENT_OTHER): Payer: Self-pay

## 2024-07-03 DIAGNOSIS — F411 Generalized anxiety disorder: Secondary | ICD-10-CM | POA: Diagnosis not present

## 2024-07-03 DIAGNOSIS — N943 Premenstrual tension syndrome: Secondary | ICD-10-CM | POA: Diagnosis not present

## 2024-07-03 MED ORDER — DULOXETINE HCL 60 MG PO CPEP
60.0000 mg | ORAL_CAPSULE | Freq: Every morning | ORAL | 5 refills | Status: AC
  Filled 2024-07-17: qty 30, 30d supply, fill #0
  Filled 2024-09-06: qty 30, 30d supply, fill #1
  Filled 2024-10-30: qty 30, 30d supply, fill #2

## 2024-07-03 MED ORDER — BUSPIRONE HCL 10 MG PO TABS
10.0000 mg | ORAL_TABLET | Freq: Two times a day (BID) | ORAL | 5 refills | Status: AC
  Filled 2024-07-17: qty 60, 30d supply, fill #0
  Filled 2024-10-30: qty 60, 30d supply, fill #1

## 2024-07-04 ENCOUNTER — Encounter (HOSPITAL_BASED_OUTPATIENT_CLINIC_OR_DEPARTMENT_OTHER): Payer: Self-pay | Admitting: Family Medicine

## 2024-07-04 DIAGNOSIS — E66811 Obesity, class 1: Secondary | ICD-10-CM

## 2024-07-10 ENCOUNTER — Other Ambulatory Visit (HOSPITAL_BASED_OUTPATIENT_CLINIC_OR_DEPARTMENT_OTHER): Payer: Self-pay

## 2024-07-10 MED ORDER — WEGOVY 0.5 MG/0.5ML ~~LOC~~ SOAJ
0.5000 mg | SUBCUTANEOUS | 1 refills | Status: AC
  Filled 2024-07-10: qty 2, 28d supply, fill #0

## 2024-07-17 ENCOUNTER — Other Ambulatory Visit (HOSPITAL_BASED_OUTPATIENT_CLINIC_OR_DEPARTMENT_OTHER): Payer: Self-pay

## 2024-07-17 ENCOUNTER — Other Ambulatory Visit: Payer: Self-pay

## 2024-07-18 ENCOUNTER — Ambulatory Visit (INDEPENDENT_AMBULATORY_CARE_PROVIDER_SITE_OTHER): Admitting: Psychology

## 2024-07-18 DIAGNOSIS — F89 Unspecified disorder of psychological development: Secondary | ICD-10-CM | POA: Diagnosis not present

## 2024-07-18 NOTE — Progress Notes (Addendum)
 Date: 07/18/2024 Appointment Start Time: 5pm Duration: 82 minutes Provider: Frederic Fire, PsyD Type of Session: Initial Appointment for Evaluation  Location of Patient: Home Location of Provider: Provider's Home (private office) Type of Contact: Caregility video visit with audio  Session Content:  Prior to proceeding with today's appointment, two pieces of identifying information were obtained from [place] to verify identity. In addition, Chloe Allen's physical location at the time of this appointment was obtained. In the event of technical difficulties, Chloe Allen shared a phone number she could be reached at. Chloe Allen and this provider participated in today's telepsychological service. Chloe Allen denied anyone else being present in the room or on the virtual appointment.  The provider's role was explained to Chloe Allen. The provider reviewed and discussed issues of confidentiality, privacy, and limits therein (e.g., reporting obligations). In addition to verbal informed consent, written informed consent for psychological services was obtained from Chloe Allen prior to the initial appointment. Written consent included information concerning the practice, financial arrangements, and confidentiality and patients' rights. Since the clinic is not a 24/7 crisis center, mental health emergency resources were shared, and the provider explained e-mail, voicemail, and/or other messaging systems should be utilized only for non-emergency reasons. This provider also explained that information obtained during appointments will be placed in their electronic medical record in a confidential manner. Chloe Allen verbally acknowledged understanding of the aforementioned and agreed to use mental health emergency resources discussed if needed. Moreover, Chloe Allen agreed information may be shared with other Chloe Allen or their referring provider(s) as needed for coordination of care. By signing the new patient documents, Chloe Allen provided written  consent for coordination of care. Chloe Allen verbally acknowledged understanding she is ultimately responsible for understanding her insurance benefits as it relates to reimbursement of telepsychological and in-person services. This provider also reviewed confidentiality, as it relates to telepsychological services, as well as the rationale for telepsychological services. This provider further explained that video should not be captured, photos should not be taken, nor should testing stimuli be copied or recorded as it would be a copyright violation. Chloe Allen expressed understanding of the aforementioned, and verbally consented to proceed. Chloe Allen is aware of the limitations of teleheath visits and verbally consented to proceed.  Chloe Allen completed the psychiatric diagnostic evaluation (history, including past, family, social, and  psychiatric history; behavioral observations; and establishment of a provisional diagnosis). The evaluation was completed in 82 minutes. Code 09208 was billed.    Mental Status Examination:  Appearance:  neat Behavior: appropriate to circumstances Mood: neutral Affect: mood congruent Speech: tangential  Eye Contact: appropriate Psychomotor Activity: restless Thought Process: denies suicidal, homicidal, and self-harm ideation, plan and intent Content/Perceptual Disturbances: none Orientation: AAOx4 Cognition/Sensorium: intact Insight: fair Judgment: good  Provisional DSM-5 diagnosis(es):  F89 Unspecified Disorder of Psychological Development   Plan: Testing is expected to answer the question, does the individual meet criteria for ADHD when age, other mental health concerns (e.g., depression and PMDD), and cognitive functioning are taken into consideration? Further testing is warranted because a diagnosis cannot be given solely based on current interview data (further data is required). Testing results are expected to answer the remaining diagnostic questions in order to  provide an accurate diagnosis and assist in treatment planning with an expectation of improved clinical outcome. Chloe Allen is currently scheduled for an appointment on 07/27/2024 at 2pm via Caregility video visit with audio.       CONFIDENTIAL PSYCHOLOGICAL EVALUATION[TB1]  ______________________________________________________________________________ Name: Chloe Allen  Date of Birth: 1985/12/15  Age: 38   SOURCE AND REASON FOR REFERRAL: Chloe Allen was self-referred for an evaluation to ascertain if she meets the criteria for Attention Deficit/Hyperactivity Disorder (ADHD).                                                                                                    BACKGROUND INFORMATION AND PRESENTING PROBLEM: Chloe Allen is a 38 year old female who resides in [place] .   Chloe Allen reported she "knew [she had] ADHD [she] got [her] master's [degree]," noting she identified with the information about ADHD that she had learned about. She endorsed the following ADHD-related symptoms:   Symptoms Frequency    Often Occasionally Infrequent/No Significant Issues Inattention Criterion (DSM-5-TR)    Making careless mistakes and missing small details. She stated she is prone to making mistakes (e.g., "sending things to the wrong pharmacy), and how it led to her changing her employment. She indicated that inattention and forgetfulness contribute to this symptom.   Being easily distracted by various stimuli and the mind often being elsewhere even when no apparent distractions exist. She stated she is "very" easily distracted by various stimuli (e.g., irrelevant tasks and thoughts, as well as sounds), even when there are no obvious distractions. She also described this symptom as being part of attentional dysregulation, noting how she occasionally experiences periods of hyperfocus to an extent that makes her "miss" higher  priority things occurring around her.   Trouble sustaining attention during conversations. She stated she "quite frequent[ly]" has trouble with this symptom, which leads to her missing parts of conversations and "repeating questions."   Does not follow through on instructions and fails to finish tasks. She described regularly having task maintenance issues, which leads to difficulties completing tasks in a timely manner.   Difficulty organizing tasks and activities. She discussed being prone to having clutter around her despite efforts to keep her environment organized, haphazardly completing tasks, and having trouble sticking to plans, adding that it can contribute to her "rushing" tasks" and making mistakes. She further discussed how time blindness, forgetfulness, and inattention contribute to this symptom.   Avoids, dislikes, or is reluctant to engage in tasks that require sustained mental effort.   She denied experiencing significant issues with this symptom. Loses things necessary for tasks or activities. She endorsed being prone to misplacing items and to bring needed items for task.   Forgetful in daily activities. She also endorsed a tendency to forget to return phone calls, notify others of plans, and to pay the bills if she "cannot see them."   Hyperactivity and Impulsivity Criterion (DSM-5-TR)    Fidgets with hands or feet or squirms in seat. She stated she habitually, and largely unconsciously, fidgets (e.g., bit[ing] on [her] nails," "tapping," and "squiggling").   Leaves seat in situations in which remaining seated is expected.   She denied experiencing significant issues with this symptom. Feelings of restlessness. She described herself as "always moving, doing something," and feeling significant discomfort if others are observing her working, which contributes to making mistakes. She shared that premenstrual dysphoric disorder contributes  to this symptom, but use of Buspar  has been  helpful.   Difficulty playing or engaging in leisure activities quietly. She discussed regularly talking during movies, which has led to her husband commenting on it. She further discussed this symptom was more severe "in the past," but use of psychotropic medication, increased insight, and efforts to "keep it down" have been helpful.   "On the go" or often acts as if "driven by a motor." She stated she is "always on the go," "moving," and "doing something."   Talks excessively. She reported she "always" "give[s] way over [the necessary information]" while speaking, as well as is prone to "repeating [herself] over and over."   Interrupts others. She noted a tendency to interrupt others, and that she has to "constantly" remind herself to "keep it down" in an effort not to.   Impatience. She stated she "hate[s] having to wait in line" or for her "turn," as well as that she is prone to "rushing" tasks, which can lead to her leaving important situations if she perceives a wait as too long.   ADHD-Related Symptoms that Assist in Identifying ADHD but are not in the DSM-5-TR Chloe Allen, 2021, p. 6-12 and [phone])    Emotional dysregulation and overstimulation. She endorsed a tendency to become "very mad" if someone breaks her attention while she is focused on a task as she "cannot regain that focus" after they do so, if multiple people are trying to talk to her at one time, and/or if she is in an environment with multiple potential distractors, adding how these difficulties "caused issues" at her prior employment. She shared that premenstrual dysphoric disorder contributes to this symptom and that the use of psychotropic medication has been helpful.   Making decisions impulsively.  She stated she "used to" often engage in impulsive actions, but that since "having kids" she has "matur[ed]."  Tending to drive much faster than others.   She denied experiencing significant issues with this symptom. Trouble following  through on promises or commitments made to others.  She noted occasional difficulties with this symptom, but that she "do[es] [her] best" to follow through on commitments.  Trouble doing things in proper order or sequence. She stated she is prone to "jumping all over" while completing tasks.     Chloe Allen discussed a history of periods of low mood, noting how grief has contributed and that she has not had a depressive episode since utilizing psychotropic medication; sleep impairment that she noted PMDD contributed to, and that Buspar  has helped manage; overeating-related behaviors that she expressed a belief that PMDD contributes to, and she utilizes Wegovy  and "weight management therapy" to assist with; and two past suicide attempts via overdose that were a "very long time ago." She expressed a belief that her ADHD-related symptoms have occurred since childhood, are consistent, and independent of mood. She stated her coping and compensatory strategies include working in environments with limited distractors (e.g., a Engineering geologist), using earbuds to reduce potential distractors, "having patients scheduled back-to-back" to limit the potential of becoming distracted, using her home assistant to help in finding her phone, and keeping items in her view.    Chloe Allen denied awareness of having ever experienced any developmental milestone delays, grade retention, learning disability diagnosis, or having an individualized education plan. However, she noted there was "no such thing as seeing a doctor in [place] [for developmental milestone delays and learning disorder]." She reported her mother described her as "very hyperactive" and prone to "getting  in trouble," which was "not normal in [her] culture," so she was "always getting labeled" and "in trouble with teachers and principals." She further reported that she was often inattentive during class, tended to forget to complete her homework, and "hated [class] assignments  back in [place]" as they were "very hard work," which contributed to her not completing assignments by their deadlines. She indicated her academic functioning overall improved upon moving to the [place] , as the classes were overall easier. However, she struggled with [place] as a second language and initially with "advanced pathology," but that once she "learned the concepts [she got As." She stated she "failed college three times" and spent an extended time studying for the "board," which she attributed to inattention while studying. She shared that she obtained a master's degree. 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 She noted her marriage has overall "been okay," but that it has had some "rough patches" and "ups and downs." She denied having any significant issues with childcare.    Chloe Allen reported her medical history is significant for PMDD that became noticeable over the past three to four years, adding that she is planning to speak with a medical provider about it. She denied awareness of ever experiencing seizures or a head injury. She reported she utilizes Buspar  and Duloxetine , as well as recently started psychotherapeutic services to "talk through" stressors and "feel[ing] down." She also reported use of one to one-to-two energy drinks a week, and infrequent use of coffee, as well as use of alcohol two to four times per year. She denied use of all other recreational and illicit substances. She also denied ever experiencing a psychiatric hospitalization or meeting the full criteria for hypomanic or manic episode; generalized anxiety; social anxiety; obsessions and compulsions; psychosis; trauma- and stressor-related disorder; homicidal ideation, plan, or intent; or legal involvement. Stated her familial mental health history is significant for possible ADHD, noting her son is currently "in  the process" of an ADHD evaluation as he "cannot sit still," "always on the go," and "cannot focus."              Frederic ONEIDA Fire, PsyD

## 2024-07-20 ENCOUNTER — Ambulatory Visit (HOSPITAL_BASED_OUTPATIENT_CLINIC_OR_DEPARTMENT_OTHER): Admitting: Certified Nurse Midwife

## 2024-07-20 ENCOUNTER — Other Ambulatory Visit (HOSPITAL_COMMUNITY)
Admission: RE | Admit: 2024-07-20 | Discharge: 2024-07-20 | Disposition: A | Discharge status: Home / Self Care | Source: Ambulatory Visit | Attending: Certified Nurse Midwife | Admitting: Certified Nurse Midwife

## 2024-07-20 ENCOUNTER — Encounter (HOSPITAL_BASED_OUTPATIENT_CLINIC_OR_DEPARTMENT_OTHER): Payer: Self-pay | Admitting: Certified Nurse Midwife

## 2024-07-20 VITALS — BP 119/93 | HR 91 | Ht 64.0 in | Wt 203.0 lb

## 2024-07-20 DIAGNOSIS — D219 Benign neoplasm of connective and other soft tissue, unspecified: Secondary | ICD-10-CM

## 2024-07-20 DIAGNOSIS — Z1151 Encounter for screening for human papillomavirus (HPV): Secondary | ICD-10-CM | POA: Diagnosis not present

## 2024-07-20 DIAGNOSIS — Z01419 Encounter for gynecological examination (general) (routine) without abnormal findings: Secondary | ICD-10-CM | POA: Diagnosis not present

## 2024-07-20 DIAGNOSIS — Z124 Encounter for screening for malignant neoplasm of cervix: Secondary | ICD-10-CM | POA: Insufficient documentation

## 2024-07-20 DIAGNOSIS — D259 Leiomyoma of uterus, unspecified: Secondary | ICD-10-CM | POA: Diagnosis not present

## 2024-07-20 DIAGNOSIS — N920 Excessive and frequent menstruation with regular cycle: Secondary | ICD-10-CM

## 2024-07-20 NOTE — Addendum Note (Signed)
 Addended byBETHA TAD RACE on: 07/20/2024 12:18 PM   Modules accepted: Orders

## 2024-07-20 NOTE — Progress Notes (Signed)
 38 y.o. G3P2010 Unknown Other or two or more races female here for annual exam.  She has 2 children ages 50, 76 (doing well). Pt works as a Publishing rights manager. They would welcome a pregnancy.  She is on Wegovy  but not finding it effective yet.   Patient's last menstrual period was 07/02/2024 (exact date).          Sexually active: Yes.    The current method of family planning is none.     Upstream - 07/20/24 1002       Pregnancy Intention Screening   Does the patient want to become pregnant in the next year? No    Does the patient's partner want to become pregnant in the next year? No    Would the patient like to discuss contraceptive options today? No      Contraception Wrap Up   Current Method No Contraceptive Precautions          Exercising: Yes.     Smoker:  no  Health Maintenance: Pap:  Pap Due today History of abnormal Pap:  no MMG:  Discussed mammogram at age 25  Colonoscopy:  n/a Screening Labs: UTD   reports that she has never smoked. She has never been exposed to tobacco smoke. She has never used smokeless tobacco. She reports that she does not currently use alcohol. She reports that she does not use drugs.  History reviewed. No pertinent past medical history.  Past Surgical History:  Procedure Laterality Date   CESAREAN SECTION      Current Outpatient Medications  Medication Sig Dispense Refill   busPIRone  (BUSPAR ) 10 MG tablet Take 1 tablet (10 mg total) by mouth 2 (two) times daily. 60 tablet 0   DULoxetine  (CYMBALTA ) 60 MG capsule Take 1 capsule (60 mg total) by mouth every morning. 90 capsule 0   hydrOXYzine  (ATARAX ) 25 MG tablet Take 1-2 tablets (25-50 mg total) by mouth at bedtime. 60 tablet 0   semaglutide -weight management (WEGOVY ) 0.5 MG/0.5ML SOAJ SQ injection Inject 0.5 mg into the skin once a week. 2 mL 1   busPIRone  (BUSPAR ) 10 MG tablet Take 1 tablet (10 mg total) by mouth 2 (two) times daily. 60 tablet 5   DULoxetine  (CYMBALTA ) 60 MG capsule Take  1 capsule (60 mg total) by mouth every morning. 90 capsule 0   DULoxetine  (CYMBALTA ) 60 MG capsule Take 1 capsule (60 mg total) by mouth in the morning. 30 capsule 5   No current facility-administered medications for this visit.    Family History  Problem Relation Age of Onset   Hypertension Mother    Hyperlipidemia Father    Hyperlipidemia Maternal Grandmother    Hypertension Maternal Grandmother    Diabetes Maternal Grandmother    Hypertension Maternal Grandfather    Hyperlipidemia Maternal Grandfather    Diabetes Maternal Grandfather    Hypertension Paternal Grandmother    Hyperlipidemia Paternal Grandmother    Diabetes Paternal Grandmother    Hypertension Paternal Grandfather    Hyperlipidemia Paternal Grandfather    Diabetes Paternal Grandfather     ROS: Constitutional: negative Genitourinary:negative and +heavy periods  Exam:   BP (!) 119/93 (BP Location: Right Arm, Patient Position: Sitting, Cuff Size: Normal)   Pulse 91   Ht 5' 4 (1.626 m)   Wt 203 lb (92.1 kg)   LMP 07/02/2024 (Exact Date)   SpO2 100%   BMI 34.84 kg/m   Height: 5' 4 (162.6 cm)  General appearance: alert, cooperative and appears stated age Head:  Normocephalic, without obvious abnormality, atraumatic Breasts: normal appearance, no masses or tenderness, Inspection negative, No nipple retraction or dimpling, No nipple discharge or bleeding, No axillary or supraclavicular adenopathy, Normal to palpation without dominant masses Abdomen: soft, non-tender; bowel sounds normal; no masses,  no organomegaly; Prior CS x 2 Extremities: extremities normal, atraumatic, no cyanosis or edema Skin: Skin color, texture, turgor normal. No rashes or lesions Lymph nodes: Cervical, supraclavicular, and axillary nodes normal. No abnormal inguinal nodes palpated Neurologic: Grossly normal   Pelvic: External genitalia:  no lesions              Urethra:  normal appearing urethra with no masses, tenderness or  lesions              Bartholins and Skenes: normal                 Vagina: normal appearing vagina with normal color and no discharge, no lesions              Cervix: no bleeding following Pap, no cervical motion tenderness, and very posterior cervix              Pap taken: Yes.   Bimanual Exam:  Uterus:  enlarged, 8 weeks size              Adnexa: no mass, fullness, tenderness               Anus:  normal sphincter tone, no lesions  Chaperone,  CMA, was present for exam.  Assessment/Plan:   1. Encounter for annual routine gynecological examination (Primary) - Continue self breast awareness. Discussed annual screening mammograms starting at age 9. - Pt would welcome a pregnancy and does not desire any contraception at this time - She is working towards weight loss with healthy diet and exercise/Wegovy . - Discussed Femara/Letrozole availability but pt aware she would need to discontinue GLP-1 prior.  2. Menorrhagia with regular cycle - US  PELVIC COMPLETE WITH TRANSVAGINAL; Future  3. Fibroids - US  PELVIC COMPLETE WITH TRANSVAGINAL; Future   RTO 1 year for annual gyn exam and prn if issues arise or if positive home urine pregnancy test. Prenatal vitamins encouraged.  Arland MARLA Roller

## 2024-07-23 ENCOUNTER — Other Ambulatory Visit (HOSPITAL_BASED_OUTPATIENT_CLINIC_OR_DEPARTMENT_OTHER): Payer: Self-pay

## 2024-07-24 LAB — CYTOLOGY - PAP
Adequacy: ABSENT
Comment: NEGATIVE
Diagnosis: NEGATIVE
High risk HPV: NEGATIVE

## 2024-07-25 ENCOUNTER — Telehealth (HOSPITAL_BASED_OUTPATIENT_CLINIC_OR_DEPARTMENT_OTHER): Admitting: Family Medicine

## 2024-07-25 ENCOUNTER — Ambulatory Visit (HOSPITAL_BASED_OUTPATIENT_CLINIC_OR_DEPARTMENT_OTHER): Payer: Self-pay | Admitting: Certified Nurse Midwife

## 2024-07-25 NOTE — Progress Notes (Unsigned)
 Front desk staff reported Ms. Kimmel needed to reschedule the appointment.               Frederic ONEIDA Fire, PsyD

## 2024-07-27 ENCOUNTER — Ambulatory Visit: Admitting: Psychology

## 2024-07-27 DIAGNOSIS — F89 Unspecified disorder of psychological development: Secondary | ICD-10-CM

## 2024-08-09 NOTE — Progress Notes (Unsigned)
 Date: 08/10/2024   Appointment Start Time: 1:04pm Duration: 35 minutes Provider: Frederic Fire, PsyD Type of Session: Testing Appointment for Evaluation  Location of Patient: Home Location of Provider: Provider's Home (private office) Type of Contact: Caregility video visit with audio  Session Content: Today's appointment was a telepsychological visit. Chloe Allen is aware it is her responsibility to secure confidentiality on her end of the session. Prior to proceeding with today's appointment, Chloe Allen's physical location at the time of this appointment was obtained as well a phone number she could be reached at in the event of technical difficulties. Chloe Allen denied anyone else being present in the room or on the virtual appointment. This provider reviewed that video should not be captured, photos should not be taken, nor should testing stimuli be copied or recorded as it would be a copyright violation. Chloe Allen expressed understanding of the aforementioned, and verbally consented to proceed. The WAIS-5 was administered, scored, and interpreted by this evaluator. Chloe Allen is aware of the limitations of teleheath visits and verbally consented to proceed.  Billing codes will be input on the feedback appointment. There are no billing codes for the testing appointment.   Provisional DSM-5 diagnosis(es):  F89 Unspecified Disorder of Psychological Development   Plan: Chloe Allen was scheduled for a feedback appointment on 08/24/2024 at 1pm via Caregility video visit with audio.                Frederic ONEIDA Fire, PsyD

## 2024-08-10 ENCOUNTER — Ambulatory Visit: Admitting: Psychology

## 2024-08-10 DIAGNOSIS — F89 Unspecified disorder of psychological development: Secondary | ICD-10-CM

## 2024-08-13 ENCOUNTER — Ambulatory Visit (INDEPENDENT_AMBULATORY_CARE_PROVIDER_SITE_OTHER): Admitting: Dermatology

## 2024-08-13 ENCOUNTER — Encounter: Payer: Self-pay | Admitting: Dermatology

## 2024-08-13 VITALS — BP 120/80 | HR 79

## 2024-08-13 DIAGNOSIS — B079 Viral wart, unspecified: Secondary | ICD-10-CM | POA: Diagnosis not present

## 2024-08-13 DIAGNOSIS — D485 Neoplasm of uncertain behavior of skin: Secondary | ICD-10-CM

## 2024-08-13 DIAGNOSIS — L810 Postinflammatory hyperpigmentation: Secondary | ICD-10-CM | POA: Diagnosis not present

## 2024-08-13 NOTE — Progress Notes (Signed)
   New Patient Visit   Subjective  Chloe Allen is a 38 y.o. female who presents for the following:   There is a lesion on her back that has been there for over 1 year  and has white stuff coming out of it.  There is a lesion on the left cheek that has been there for years and is bothersome.  Patient denies personal and family history of skin cancer.   The following portions of the chart were reviewed this encounter and updated as appropriate: medications, allergies, medical history  Review of Systems:  No other skin or systemic complaints except as noted in HPI or Assessment and Plan.  Objective  Well appearing patient in no apparent distress; mood and affect are within normal limits.  A focused examination was performed of the following areas: Left cheek and back  Relevant exam findings are noted in the Assessment and Plan.  Head - Anterior (Face) Verrucous papule  Right Lower Back 1.2 cm thick black hyperkeratotic plaque   Assessment & Plan   VIRAL WARTS, UNSPECIFIED TYPE Head - Anterior (Face) Destruction of lesion - Head - Anterior (Face) Complexity: simple   Destruction method: cryotherapy   Informed consent: discussed and consent obtained   Timeout:  patient name, date of birth, surgical site, and procedure verified Lesion destroyed using liquid nitrogen: Yes   Region frozen until ice ball extended beyond lesion: Yes   Outcome: patient tolerated procedure well with no complications   Post-procedure details: wound care instructions given    NEOPLASM OF UNCERTAIN BEHAVIOR OF SKIN Right Lower Back Skin / nail biopsy Type of biopsy: tangential   Informed consent: discussed and consent obtained   Timeout: patient name, date of birth, surgical site, and procedure verified   Procedure prep:  Patient was prepped and draped in usual sterile fashion Prep type:  Isopropyl alcohol Anesthesia: the lesion was anesthetized in a standard fashion   Anesthetic:  1%  lidocaine w/ epinephrine 1-100,000 buffered w/ 8.4% NaHCO3 Instrument used: DermaBlade   Hemostasis achieved with: aluminum chloride   Outcome: patient tolerated procedure well   Post-procedure details: sterile dressing applied and wound care instructions given   Dressing type: bandage and petrolatum    Specimen 1 - Surgical pathology Differential Diagnosis: r/o ISK vs other  Check Margins: No POSTINFLAMMATORY HYPERPIGMENTATION    POST-INFLAMMATORY HYPERPIGMENTATION (PIH)- discuss risk of PIH Exam: hyperpigmented macules and/or patches at left cheek   Post-inflammatory hyperpimentation (PIH)  is a benign condition that comes from having previous inflammation in the skin and will fade with time over months to sometimes years. Recommend daily sun protection including sunscreen SPF 30+ to sun-exposed areas. - Recommend treating any itchy or red areas on the skin quickly to prevent new areas of PIH. Once rash has cleared, treating with prescription medicines such as hydroquinone may help fade dark spots faster.      Return if symptoms worsen or fail to improve.  LILLETTE Rollene Gobble, RN, am acting as scribe for RUFUS CHRISTELLA HOLY, MD .   Documentation: I have reviewed the above documentation for accuracy and completeness, and I agree with the above.  RUFUS CHRISTELLA HOLY, MD

## 2024-08-13 NOTE — Patient Instructions (Addendum)
 For areas treated with Liquid Nitrogen:  Keep clean with soap and water.  Apply Vaseline or Aquaphor twice daily.   Refrain from using retinoid over the lesion on the left cheek until healed.    Patient Handout: Wound Care for Skin Biopsy Site  Taking Care of Your Skin Biopsy Site  Proper care of the biopsy site is essential for promoting healing and minimizing scarring. This handout provides instructions on how to care for your biopsy site to ensure optimal recovery.  1. Cleaning the Wound:  Clean the biopsy site daily with gentle soap and water. Gently pat the area dry with a clean, soft towel. Avoid harsh scrubbing or rubbing the area, as this can irritate the skin and delay healing.  2. Applying Aquaphor and Bandage:  After cleaning the wound, apply a thin layer of Aquaphor ointment to the biopsy site. Cover the area with a sterile bandage to protect it from dirt, bacteria, and friction. Change the bandage daily or as needed if it becomes soiled or wet.  3. Continued Care for One Week:  Repeat the cleaning, Aquaphor application, and bandaging process daily for one week following the biopsy procedure. Keeping the wound clean and moist during this initial healing period will help prevent infection and promote optimal healing.  4. Massaging Aquaphor into the Area:  ---After one week, discontinue the use of bandages but continue to apply Aquaphor to the biopsy site. ----Gently massage the Aquaphor into the area using circular motions. ---Massaging the skin helps to promote circulation and prevent the formation of scar tissue.   Additional Tips:  Avoid exposing the biopsy site to direct sunlight during the healing process, as this can cause hyperpigmentation or worsen scarring. If you experience any signs of infection, such as increased redness, swelling, warmth, or drainage from the wound, contact your healthcare provider immediately. Follow any additional instructions  provided by your healthcare provider for caring for the biopsy site and managing any discomfort. Conclusion:  Taking proper care of your skin biopsy site is crucial for ensuring optimal healing and minimizing scarring. By following these instructions for cleaning, applying Aquaphor, and massaging the area, you can promote a smooth and successful recovery. If you have any questions or concerns about caring for your biopsy site, don't hesitate to contact your healthcare provider for guidance.    Important Information  Due to recent changes in healthcare laws, you may see results of your pathology and/or laboratory studies on MyChart before the doctors have had a chance to review them. We understand that in some cases there may be results that are confusing or concerning to you. Please understand that not all results are received at the same time and often the doctors may need to interpret multiple results in order to provide you with the best plan of care or course of treatment. Therefore, we ask that you please give us  2 business days to thoroughly review all your results before contacting the office for clarification. Should we see a critical lab result, you will be contacted sooner.   If You Need Anything After Your Visit  If you have any questions or concerns for your doctor, please call our main line at [phone] If no one answers, please leave a voicemail as directed and we will return your call as soon as possible. Messages left after 4 pm will be answered the following business day.   You may also send us  a message via MyChart. We typically respond to MyChart messages within 1-2  business days.  For prescription refills, please ask your pharmacy to contact our office. Our fax number is [phone].  If you have an urgent issue when the clinic is closed that cannot wait until the next business day, you can page your doctor at the number below.    Please note that while we do our best to  be available for urgent issues outside of office hours, we are not available 24/7.   If you have an urgent issue and are unable to reach us , you may choose to seek medical care at your doctor's office, retail clinic, urgent care center, or emergency room.  If you have a medical emergency, please immediately call 911 or go to the emergency department. In the event of inclement weather, please call our main line at [phone] for an update on the status of any delays or closures.  Dermatology Medication Tips: Please keep the boxes that topical medications come in in order to help keep track of the instructions about where and how to use these. Pharmacies typically print the medication instructions only on the boxes and not directly on the medication tubes.   If your medication is too expensive, please contact our office at [phone] or send us  a message through MyChart.   We are unable to tell what your co-pay for medications will be in advance as this is different depending on your insurance coverage. However, we may be able to find a substitute medication at lower cost or fill out paperwork to get insurance to cover a needed medication.   If a prior authorization is required to get your medication covered by your insurance company, please allow us  1-2 business days to complete this process.  Drug prices often vary depending on where the prescription is filled and some pharmacies may offer cheaper prices.  The website www.goodrx.com contains coupons for medications through different pharmacies. The prices here do not account for what the cost may be with help from insurance (it may be cheaper with your insurance), but the website can give you the price if you did not use any insurance.  - You can print the associated coupon and take it with your prescription to the pharmacy.  - You may also stop by our office during regular business hours and pick up a GoodRx coupon card.  - If you need your  prescription sent electronically to a different pharmacy, notify our office through [facility] or by phone at [phone]

## 2024-08-14 LAB — SURGICAL PATHOLOGY

## 2024-08-16 ENCOUNTER — Ambulatory Visit: Payer: Self-pay | Admitting: Dermatology

## 2024-08-24 ENCOUNTER — Ambulatory Visit: Admitting: Psychology

## 2024-08-24 DIAGNOSIS — F89 Unspecified disorder of psychological development: Secondary | ICD-10-CM

## 2024-08-24 NOTE — Progress Notes (Signed)
 Date: 08/24/2024 Appointment Start Time: 1:01pm Duration: 18 minutes Provider: Frederic Fire, PsyD Type of Session: Individual Therapy  Location of Patient: Home (private location) Location of Provider: Provider's Home (private office) Type of Contact: Caregility video visit with audio  Session Content: Jolly is aware it is her responsibility to secure confidentiality on her end of the session. She provided verbal consent to proceed with today's appointment. Prior to proceeding with today's appointment, Kielee's physical location at the time of this appointment was obtained as well a phone number she could be reached at in the event of technical difficulties. Josette is aware of the limitations of teleheath visits and verbally consented to proceed.  This Clinical research associate and Ms. Shepherd discussed the available results, as well as this writer's plan to readminister the CNS Vital Signs given the possible validity concerns. She described the available results as seemingly being accurate for her and noted understanding of the rationale for the readministration of the CNS Vital Signs. She denied having any other issues or concerns.   Billing codes will be input on the feedback appointment. There are no billing codes for this follow-up appointment.   DSM-5 Diagnosis(es):  F89 Unspecified disorder of psychological development   Plan: Puanani is currently scheduled for a feedback appointment on 08/30/2024 at 8am via Caregility video visit with audio.        Frederic ONEIDA Fire, PsyD

## 2024-08-29 NOTE — Progress Notes (Unsigned)
 Testing and Report Writing Information: The following measures  were administered, scored, and interpreted by this provider:  Generalized Anxiety Disorder-7 (GAD-7; 5 minutes), Patient Health Questionnaire-9 (PHQ-9; 5 minutes), Wechsler Adult Intelligence Scale-Fourth Edition (WAIS-5; 70 minutes), CNS Vital Signs (45 minutes) and Re-Administration (30 minutes), Adult Attention Deficit/Hyperactivity Disorder Self-Report Scale Checklist (ASRSv1.1; 15 minutes), Behavior Rating Inventory for Executive Function - 2A - Self Report (BRIEF 2A; 20 minutes) and Behavior Rating Inventory for Executive Function - 2A - Informant (BRIEF-2A; 20 minutes), and Personality Assessment Inventory (PAI; 50 minutes). A total of 260 minutes was spent on the administration and scoring of the aforementioned measures. Codes 03863 and [phone] (8 units) were billed.  Please see the assessment for additional details. This provider completed the written report which includes integration of patient data, interpretation of standardized test results, interpretation of clinical data, review of information provided by [facility] and any collateral information/documentation, and clinical decision making (395 minutes in total).  Feedback Appointment: Date: 08/30/2024 Appointment Start Time: 8am Duration: 42 minutes Provider: Frederic Fire, PsyD Type of Session: Feedback Appointment for Evaluation  Location of Patient: Home Location of Provider: Provider's Home (private office) Type of Contact: Caregility video visit with audio  Session Content: Today's appointment was a telepsychological visit Chloe Allen to COVID-19. Chloe Allen is aware it is her responsibility to secure confidentiality on her end of the session. She provided verbal consent to proceed with today's appointment. Prior to proceeding with today's appointment, Chloe Allen's physical location at the time of this appointment was obtained as well a phone number she could be reached at in the event  of technical difficulties. Chloe Allen denied anyone else being present in the room or on the virtual appointment. Chloe Allen is aware of the limitations of teleheath visits and verbally consented to proceed.  This provider and Chloe Allen completed the interactive feedback session which includes reviewing the aforementioned measures, treatment recommendations, and diagnostic conclusions.   The interactive feedback session was completed today and a total of 42 minutes was spent on feedback. Code 03867 was billed for feedback session.   DSM-5 Diagnosis(es):  F90.0 Attention-Deficit/Hyperactivity Disorder, Predominately Inattentive Presentation, Mild  Time Requirements: Assessment scoring and interpreting: 260 minutes (billing code 03863 and [phone] [8 units]) Feedback: 42 minutes (billing code 03867) Report writing: 410 total minutes. 07/20/2024: 4:15-5:10pm and 5:25-6pm. 07/21/2024: 10:50-11:10am and 3:15-3:35pm. 08/16/2024: 12:40-12:50pm and 7:10-7:25pm. 08/17/2024: 7:50-8:50pm. 08/24/2024: 1-1:40pm and 4:20-4:45pm. 08/26/2025: 5:55-6:30pm and 9:55-10:50am. 08/28/2024: 8:55-9:20pm. 08/30/2024: 8:50-9am and 10:20-10:25am. (billing code 03866 [7 units])  Plan: Chloe Allen provided verbal consent for her evaluation to be sent via e-mail. No further follow-up planned by this provider.       CONFIDENTIAL PSYCHOLOGICAL EVALUATION ______________________________________________________________________________ Name: Chloe Allen  Date of Birth: 04/24/1986    Age: 32 Dates of Evaluation: 07/18/2024, 08/09/2024, 08/10/2024, 08/15/2024, and 08/24/2024   SOURCE AND REASON FOR REFERRAL: Chloe Allen was self-referred for an evaluation to ascertain if she meets the criteria for Attention Deficit/Hyperactivity Disorder (ADHD).   EVALUATIVE PROCEDURES: Clinical Interview with Chloe Allen (07/18/2024) Wechsler Adult Intelligence Scale-Fourth Edition (WAIS-5; 08/10/2024) CNS Vital Signs (08/24/2024) Adult  Attention Deficit/Hyperactivity Disorder Self-Report Scale Checklist (08/24/2024) Behavior Rating Inventory for Executive Function - A - Self Report Behavior Rating Inventory for Executive Function - 2A - Self Report (BRIEF-2A; 08/15/2024) and Informant (08/09/2024) Personality Assessment Inventory (PAI; 08/09/2024) Patient Health Questionnaire-9 (PHQ-9) Generalized Anxiety Disorder-7 (GAD-7)   BACKGROUND INFORMATION AND PRESENTING PROBLEM: Chloe Allen is a 38 year old female who resides in [place] .  Chloe Allen reported  she "knew [she had] ADHD when [she] got [her] master's [degree]," noting she identified with the information about ADHD that she had learned about. She endorsed experiencing the following ADHD-related symptoms:  Symptoms Frequency   Often Occasionally Infrequent/No Significant Issues  Inattention Criterion (DSM-5-TR)     Making careless mistakes and missing small details.  She stated she is prone to making mistakes (e.g., "sending things to the wrong pharmacy"), and it has led to her changing her employment. She indicated that inattention and forgetfulness contribute to this symptom.     Being easily distracted by various stimuli and the mind often being elsewhere even when no apparent distractions exist.  She stated she is "very" easily distracted by various stimuli (e.g., irrelevant tasks and thoughts, as well as sounds), even when there are no obvious distractions. She also described this symptom as being part of attentional dysregulation, noting she occasionally experiences periods of hyperfocus to an extent that makes her "miss" higher priority things occurring around her.     Trouble sustaining attention during conversations.  She stated she "quite frequent[ly]" has trouble with this symptom, which leads to her missing parts of conversations and "repeating questions."     Does not follow through on instructions and fails to finish tasks.  She described regularly  having task maintenance issues, which leads to difficulties completing tasks in a timely manner.     Difficulty organizing tasks and activities.  She discussed being prone to having clutter around her despite efforts to keep her environment organized, haphazardly completing tasks, and having trouble sticking to plans, adding that it can contribute to her "rushing" tasks" and making mistakes. She further discussed time blindness, forgetfulness, and inattention contribute to this symptom.      Avoids, dislikes, or is reluctant to engage in tasks that require sustained mental effort.   She denied experiencing significant issues with this symptom.   Loses things necessary for tasks or activities.  She endorsed being prone to misplacing items and not bringing needed items for task.    Forgetful in daily activities.  She also endorsed a tendency to forget to return phone calls, notify others of plans, and to pay the bills if she "cannot see them."    Hyperactivity and Impulsivity Criterion (DSM-5-TR)     Fidgets with hands or feet or squirms in seat. She stated she habitually, and largely unconsciously, fidgets (e.g., "bit[ing] on [her] nails," "tapping," and "squiggling").     Leaves seat in situations in which remaining seated is expected.   She denied experiencing significant issues with this symptom.  Feelings of restlessness. She described herself as "always moving, doing something," and feeling significant discomfort if others are observing her working, which contributes to making mistakes. She shared that premenstrual dysphoric disorder contributes to this symptom, but use of Buspar  has been helpful.     Difficulty playing or engaging in leisure activities quietly. She discussed regularly talking during movies, which has led to her husband commenting on it. She further discussed this symptom was more severe "in the past," but use of psychotropic medication, increased insight, and efforts to "keep it down"  have been helpful.     "On the go" or often acts as if "driven by a motor." She stated she is "always on the go," "moving," and "doing something."    Talks excessively.  She reported she "always" "give[s] way over [the necessary information]" while speaking, as well as is prone to "repeating [herself] over and over."  Interrupts others.  She noted a tendency to interrupt others, and that she has to "constantly" remind herself to "keep it down" in an effort not to.     Impatience.  She stated she "hate[s] having to wait in line" or for her "turn," as well as that she is prone to "rushing" tasks, which can lead to her leaving important situations if she perceives a wait as too long.    ADHD-Related Symptoms that Assist in Identifying ADHD but are not in the DSM-5-TR Jeanna, 2021, p. 6-12 and [phone])     Emotional dysregulation and overstimulation. She endorsed a tendency to become "very mad" if someone breaks her attention while she is focused on a task as she "cannot regain that focus" after they do so, if multiple people are trying to talk to her at one time, and/or if she is in an environment with multiple potential distractors, adding these difficulties "caused issues" at her prior employment. She shared that premenstrual dysphoric disorder contributes to this symptom and that the use of psychotropic medication has been helpful.    Making decisions impulsively.  She stated she "used to" often engage in impulsive actions, but that since "having kids" she has "matur[ed]."   Tending to drive much faster than others.   She denied experiencing significant issues with this symptom.  Trouble following through on promises or commitments made to others.   She noted occasional difficulties with this symptom, but that she "do[es] [her] best" to follow through on commitments.    Trouble doing things in proper order or sequence. She stated she is prone to "jumping all over" while completing tasks.     Ms.  Chloe Allen discussed a history of periods of low mood, noting grief has contributed and that she has not had a depressive episode since utilizing psychotropic medication; sleep impairment that she noted PMDD contributed to, and that Buspar  has helped manage; overeating-related behaviors that she expressed a belief that PMDD contributes to, and she utilizes Wegovy  and "weight management therapy" to assist with; and two past suicide attempts via overdose that were a "very long time ago." She expressed a belief that her ADHD-related symptoms have occurred since childhood, are consistent, and independent of mood. She stated her coping and compensatory strategies include working in environments with limited distractors (e.g., a engineering geologist), using earbuds to reduce potential distractors, "having patients scheduled back-to-back" to limit the potential of becoming distracted, using her home assistant to help in finding her phone, and keeping items in her view.   Chloe Allen denied awareness of having ever experienced any developmental milestone delays, grade retention, learning disability diagnosis, or having an individualized education plan. However, she noted there was "no such thing as seeing a doctor in [place] [for developmental milestone delays and learning disorder]." She reported her mother described her as "very hyperactive" and prone to "getting in trouble," which was "not normal in [her] culture," so she was "always getting labeled" and "in trouble with teachers and principals." She further reported that she was often inattentive during class, tended to forget to complete her homework, and "hated [class] assignments back in [place]" as they were "very hard work," which contributed to her not completing assignments by their deadlines. Chloe Allen indicated her academic functioning overall improved upon moving to the [place] , as the classes were overall easier. However, she struggled with English as a second language  and initially with "advanced pathology," but that once she "learned the concepts [she] got As." She stated  she "failed college three times" and spent an extended time studying for the "board," which she attributed to inattention while studying. She shared that she obtained a master's degree. 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 She noted her marriage has overall "been okay," but that it has had some "rough patches" and "ups and downs." She denied having any significant issues with childcare.   Ms. Dois reported her medical history is significant for PMDD that became noticeable over the past three to four years, adding that she is planning to speak with a medical provider about it. She denied awareness of ever experiencing seizures or a head injury. She reported she utilizes Buspar  and Duloxetine , as well as recently started psychotherapeutic services to "talk through" stressors and "feel[ing] down." She also reported use of one to one-to-two energy drinks a week, and infrequent use of coffee, as well as use of alcohol two to four times per year. She denied use of all other recreational and illicit substances. She also denied ever experiencing a psychiatric hospitalization or meeting the full criteria for hypomanic or manic episode; generalized anxiety; social anxiety; obsessions and compulsions; psychosis; trauma- and stressor-related disorder; homicidal ideation, plan, or intent; or legal involvement. Chloe Allen shared her familial mental health history is significant for possible ADHD, noting her son is currently "in the process" of an ADHD evaluation as he "cannot sit still," "always on the go," and "cannot focus."   Chart Review: Per a 04/13/2024 appointment note, Damien Junk, LCSW reported Ms. Oshana shared she  "had [ADHD-related problems in school], but it was undiagnosed."  BEHAVIORAL  OBSERVATIONS: Ms. Aune presented on time for the evaluation. She was well-groomed. She was oriented to the time, place, person, and purpose of the appointment. During the interview, Ms. Parkinson was regularly tangential, fidgeted (e.g., played with a pencil), and occasionally interrupted this evaluator before completion of a question. During the evaluation, Chloe Allen verbalized and/or demonstrated working memory-related problems (e.g., she repeated verbally provided information to herself in what appeared to be an effort to retain it as well as looking up in what appeared to be an effort to limit potential distractors). Throughout the evaluation, she maintained appropriate eye contact. Her thought processes and content were logical, coherent, and goal-directed. There were no overt signs of a thought disorder or perceptual disturbances, nor did she report such symptomatology. There was no evidence of paraphasias (i.e., errors in speech, gross mispronunciations, and word substitutions), repetition deficits, or disturbances in volume or prosody (i.e., rhythm and intonation). Overall, based on Ms. Oestreicher's approach to testing, the current results are believed to be a fair estimate of her abilities. However, she noted English is her second language. As such, her performance on the WAIS-5's Verbal Comprehension Index subtests may underestimate her verbal reasoning abilities.   PROCEDURAL CONSIDERATIONS: Psychological testing measures were conducted through a virtual visit with video and audio capabilities, but otherwise in a standard manner.   The Wechsler Adult Intelligence Scale, Fifth Edition (WAIS-5) was administered via remote telepractice using digital stimulus materials on [employer]'s Q-global system. The remote testing environment appeared free of distractions, adequate rapport was established with the examinee via video/audio capabilities, and Chloe Allen appeared appropriately engaged in the task throughout  the session. No significant technological problems or distractions were noted during administration. Modifications to the standardization procedure included: none. The WAIS-5 subtests, or similar tasks, have received initial validation in several samples  for remote telepractice and digital format administration, and the results are considered a valid description of Chloe Allen's skills and abilities.  CLINICAL FINDINGS:  COGNITIVE FUNCTIONING  Wechsler Adult Intelligence Scale, Fourth Edition (WAIS-5): Chloe Allen completed subtests of the WAIS-5, a full-scale measure of cognitive ability. The WAIS-5 comprises four indices that measure cognitive processes that are components of intellectual ability; however, only subtests from the Verbal Comprehension and Working Memory indices were administered. As a result, Full-Scale-IQ (FSIQ) and General Ability Index (GAI) could not be determined.   WAIS-5 Scale/Subtest Composite Score/Scaled Score 95% Confidence Interval Percentile Rank Qualitative Description  Verbal Comprehension (VCI) 79 73-88 8 Very Low  Similarities 5     Vocabulary 7     Working Memory (WMI) 94 87-102 34 Average  Digit Sequencing  9     Running Digits 9     Digits Forward 7       The Verbal Comprehension Index (VCI) measures one's ability to receive, comprehend, and express language. It also measures the ability to retrieve previously learned information and to understand relationships between words and concepts presented orally. Ms. Kincannon obtained a VCI score of 79 (8th percentile), placing her in the very low range compared to same-aged peers. Her performance on the subtests comprising this index was similar, which suggests her verbal reasoning abilities are comparably developed. However, it is important to note that Ms. Righetti mentioned English is her second language. As such, her VCI score may underestimate her verbal reasoning abilities.   The Working Memory Index (WMI) measures  one's ability to sustain attention, concentrate, and exert mental control. Ms. Branscomb obtained a WMI score of 94 (34th percentile), placing her in the average range compared to same-aged peers. Her performance on the subtests was comparable.   ATTENTION AND PROCESSING  CNS Vital Signs: The CNS Vital Signs assessment evaluates the neurocognitive status of an individual and covers a range of mental processes. The results of the CNS Vital Signs testing indicated very low neurocognitive processing ability. However, her results were labeled possibly invalid. Attentional abilities were in the very low range. Executive function and cognitive flexibility were very low. Working memory was average. Psychomotor speed, motor speed, processing speed, and reaction time were very low, which suggests an impairment in hand-eye coordination, thinking speed, and responsiveness. Visual memory (images) was very low and verbal memory (words) was low average, which indicates verbal memory is a relative strength. The results suggest Ms. Jasinski experiences impairment in visual memory, psychomotor speed, reaction time, complex attention, cognitive flexibility, processing speed, executive function, sustained attention, simple attention, and motor speed, and a weakness in verbal memory. Upon follow-up, Chloe Allen reported that the CNS Vital Signs was "too long," which led to her going "off track." She also reported the "second half" of the measure's "speed" made it so she was "not able to keep up." As such, the CNS Vital Signs was re-administered. Upon re-administration, her results were primarily deemed valid, still in the very low range. However, Finger Tapping Test results continued to be labeled possibly invalid, which resulted in her psychomotor speed and motor speed results being deemed potentially invalid.  Domain  Standard Score Percentile Validity Indicator Guideline  Neurocognitive Index 6 1 No Very Low  Composite Memory 69 2  Yes Very Low  Verbal Memory 87 19 Yes Low Average  Visual Memory 62 1 Yes Very Low  Psychomotor Speed 26 1 No Very Low  Psychomotor Speed Re-Administration 29 1 No Very Low  Reaction  Time 16 1 Yes Very Low  Reaction Time Re-Administration 32 1 Yes Very Low  Complex Attention -104 1 No Very Low  Complex Attention Re-Administration 54 1 Yes Very Low  Cognitive Flexibility 23 1 No Very Low  Cognitive Flexibility Re-Administration 65 1 Yes Very Low  Processing Speed  64 1 Yes Very Low  Processing Speed Re-Administration 65 1 Yes Very Low  Executive Function 21 1 No Very Low  Executive Function Re-Administration 67 1 Yes Very Low  Working Memory 90 25 No Average  Sustained Attention 55 1 No Very Low  Simple Attention -509 1 No Very Low  Simple Attention Re-Administration -19 1 Yes Very Low  Motor Speed 31 1 No Very Low  Motor Speed Re-Administration 35 1 No Very Low   EXECUTIVE FUNCTION  Behavior Rating Inventory of Executive Function, Second Edition EVENT ORGANISER) Self-Report: Chloe Allen completed the Self-Report Form of the Behavior Rating Inventory of Executive Function-Adult Version, Second Edition Chloe Allen), which has three domains that evaluate cognitive, behavioral, and emotional regulation, and a Global Executive Composite score provides an overall snapshot of executive functioning. There are no missing item responses in the protocol. The Negativity, Infrequency, and Inconsistency scales are not elevated, suggesting she did not respond to the protocol in an overly negative, haphazard, extreme, or inconsistent manner. In the context of these validity considerations, ratings of Chloe Allen 's everyday executive function suggest some areas of concern. The overall index score, the GEC, was within normal limits (GEC T = 58, %ile = 82). The Behavior Regulation Index (BRI), Emotion Regulation Index (ERI), and Cognitive Regulation Index (CRI) scores were within normal limits (BRI T = 55, %ile = 75;  ERI T = 46, %ile = 43; CRI T = 64, %ile = 91). Ms. Tegeler indicated difficulty with her ability to sustain working memory. She did not describe her ability to resist impulses, be aware of her functioning in social settings, adjust well to changes, react to events appropriately, get going on tasks and activities and independently generate ideas, plan and organize her approach to problem solving appropriately, be appropriately cautious in her approach to tasks and check for mistakes, and keep materials and belongings reasonably well-organized as problematic. However, the Inhibit, Plan/Organize, and Task Monitor scales approached an abnormal elevation.   Scale/Index  Raw Score T Score Percentile Qualitative Description  Inhibit 13 61 86 Approaching an Elevation  Self-Monitor 7 48 59 Within Normal Limits  Behavior Regulation Index (BRI) 20 55 75 Within Normal Limits  Shift 9 52 64 Within Normal Limits  Emotional Control 8 43 42 Within Normal Limits  Emotion Regulation Index (ERI) 17 46 43 Within Normal Limits  Initiate 12 52 67 Within Normal Limits  Working Memory 20 81 99 Highly Elevated  Plan/Organize 15 63 91 Approaching an Elevation  Task Monitor 11 62 90 Approaching an Elevation  Organization of Materials 12 53 73 Within Normal Limits  Cognitive Regulation Index (CRI) 70 64 91 Approaching an Elevation  Global Executive Composite (GEC) 107 58 82 Within Normal Limits   Validity Scale Raw Score Cumulative Percentile Protocol Classification  Inconsistency 2 98 Acceptable  Negativity 0 98 Acceptable  Infrequency 0 98 Acceptable  Behavior Rating Inventory of Executive Function, Second Edition EVENT ORGANISER) Informant:  Chloe Allen husband, Mr. Chloe Allen, completed the Informant Form of the Behavior Rating Inventory of Executive Function-Adult Version, Second Edition (BRIEF-2A), which is equivalent to the Self-Report version and has three domains that evaluate cognitive, behavioral, and emotional  regulation,  and a Ship Broker Composite score provides an overall snapshot of executive functioning. There are no missing item responses in the protocol. The Negativity, Infrequency, and Inconsistency scales are not elevated, suggesting he did not respond to the protocol in an overly negative, haphazard, extreme, or inconsistent manner. In the context of these validity considerations, Mr. Chloe ratings of Ms. Zawistowski 's everyday executive function suggest some areas of concern. The overall index score, the GEC, was within normal limits (GEC T = 63, %ile = 89). The Behavior Regulation Index (BRI) and Emotion Regulation Index (ERI) scores were within normal limits (BRI T = 59, %ile = 85; ERI T = 51, %ile = 69), but the Cognitive Regulation Index (CRI) score was mildly elevated (CRI T = 66, %ile = 93). Mr. 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 He did not describe her ability to resist impulses, be aware of her functioning in social settings, adjust well to changes, react to events appropriately, get going on tasks and activities and independently generate ideas, be appropriately cautious in their approach to tasks and check for mistakes, and keep materials and belongings reasonably well-organized as problematic. However, the Task Monitor scale approached an abnormal elevation. The overall profile suggests that Ms. Barnhardt is perceived as having difficulties with working memory and with planning and organization. Individuals with similar elevations on the Working Memory scale but without significant elevations on the Inhibit, Shift, and Emotional Control scales are often described as generally inattentive. Without appropriate working memory, Ms. Habig ability to maintain focus for appropriate lengths of time may be compromised. Further, she may have secondary difficulty grasping the gist of new  information and developing a plan of approach for future-oriented problem-solving. This profile is often seen in individuals with inattentive-type attention disorders. Upon follow-up, Ms. Grayce shared a belief that her and her husband's perception of her executive functioning abilities captures her experiences well.   Scale/Index  Raw Score T Score Percentile Qualitative Description  Inhibit 13 59 88 Within Normal Limits  Self-Monitor 9 54 82 Within Normal Limits  Behavior Regulation Index (BRI) 22 59 85 Within Normal Limits  Shift 10 57 85 Within Normal Limits  Emotional Control 9 46 51 Within Normal Limits  Emotion Regulation Index (ERI) 19 51 69 Within Normal Limits  Initiate 11 52 70 Within Normal Limits  Working Memory 22 86 >99 Highly Elevated  Plan/Organize 16 67 93 Mildly Elevated  Task-Monitor 12 63 99 Approaching an Elevation  Organization of Materials 12 51 63 Within Normal Limits  Cognitive Regulation Index (CRI) 73 66 93 Mildly Elevated  Global Executive Composite (GEC) 114 63 89 Approaching an Elevation   Validity Scale Raw Score Cumulative Percentile Protocol Classification  Inconsistency 2 14 73  Negativity 1 15 73  Infrequency 1 7 59   BEHAVIORAL FUNCTIONING   Patient Health Questionnaire-9 (PHQ-9): Ms. Heskett completed the PHQ-9, a self-report measure that assesses symptoms of depression. She scored 12/27, which indicates moderate depression.   Generalized Anxiety Disorder-7 (GAD-7): Chloe Allen completed the GAD-7, a self-report measure that assesses symptoms of anxiety. She scored 15/21, which indicates severe anxiety.   Adult ADHD Self-Report Scale Symptom Checklist (ASRS): Ms. Borgen reported the following symptoms as sometimes: misplacing or has difficulty finding things, leaving her seat when expected to stay seated, talking too much in social situations, interrupting others or finishing their sentences, difficulty waiting for her turn in turn-taking situations.  She  endorsed the following symptoms as occurring often: difficulty wrapping up the final details of a project following the completion of challenging aspects, avoiding or delaying getting started on tasks requiring a lot of thought, making careless mistakes when working on boring or complex projects, struggling to concentrate on what people say even when they are speaking directly to her, and feeling restless or fidgety. She endorsed the following symptoms as very often: difficulty getting things in order when a task requires organization, problems remembering appointments or obligations, fidgeting or squirming, feeling overly active and compelled to do things, struggling to sustain attention when doing boring or repetitive work, and being distracted by the noise around her. The endorsement of at least four items in Part A is highly consistent with ADHD in adults. The frequency scores of Part B provides additional cues. Ms. Mendibles scored 6/6 on Part A and 7/12 on Part B, which is considered a positive screening for ADHD.   Personality Assessment Inventory (PAI): The PAI is an objective inventory of adult personality. The validity indicators suggested Ms. Kohrs responded consistently to similar item content (ICN T = 43), attended appropriately to item content (INF T = 47), and did not appear to portray herself in an overly negative manner (NIM T = 44). However, she appeared to have attempted to portray herself as free of common shortcomings that most people will admit to (PIM T = 64). This response style could be overt, but could also involve a covert, automatic defensive process. Moreover, she is likely to admit to few difficulties and will rigidly resist efforts to change the status quo (RXR T = 62). As such, her PAI results likely underrepresent the extent and degree of significant test findings. No clinical scales were elevated. She endorsed having close, generally supportive relationships with friends and  family (NON T = 42). Upon follow-up, Ms. Bussie expressed a belief she was trying to be honest in her response to this measure, although noted she is generally less likely to "admit to trauma." This may at least partially explain the identified validity concern.     SUMMARY AND CLINICAL IMPRESSIONS: Chloe Allen is a 38 year old female who was self-referred for an evaluation to determine if she currently meets the criteria for a diagnosis of Attention-Deficit/Hyperactivity Disorder (ADHD).       Chloe Allen reported she "knew [she had] ADHD when [she] got [her] master's [degree]," noting she identified with the information about ADHD that she had learned about. She expressed a belief that her ADHD-related symptoms have occurred since childhood, are consistent, and independent of mood.  Ms. Mccarey was administered assessments during the evaluation to measure her current cognitive abilities. Her verbal comprehension abilities were in the very low range and comparably developed. Her ability to sustain attention, concentrate, and exert mental control was in the average range, which suggests her ability to sustain attention, concentrate, and exert mental control are a relative strength compared to her verbal reasoning abilities. However, it is important to note that Ms. Longnecker reported English is her second language, which likely negatively impacted her performance on VCI subtests. Results of the CNS Vital Signs indicated very low neurocognitive processing abilities. However, her results were deemed potentially invalid on the first and second administration of the measure. Ms. Whitacre stated the CNS Vital Signs was "too long," which led to trouble sustaining her attention on the measure. She also stated that the "second half" and the "speed" at which it presented stimuli on various subtests made it  so she was "not able to keep up." As such, a partial administration of the CNS Vital Signs assessment was  conducted. Her results continued to fall in the very low range, and several remained deemed potentially invalid.   During the clinical interview and on self-report measures, Ms. Gammell endorsed executive functioning concerns that include attentional dysregulation, hyperactivity, and impulsivity-related symptoms, and meeting the full criteria for ADHD. However, she and her husband's, Mr. Chloe Allen, [facility] results indicate she is not experiencing multiple significant executive functioning issues (i.e., only one to one-to-two scales were elevated on their results). While her endorsement of symptoms during the clinical interview and on the ASRSv1.1 appear to meet the threshold for full criteria of ADHD, and her and Mr. Chloe Allen results are indicative of her experiencing attentional issues despite many of the scales not being abnormally elevated, potentially invalid objective measures (i.e., CNS Vital Signs and WAIS-5) and PAI results, English being her second language, and the discrepancies mentioned above make creating a diagnostic conclusion difficult. Ultimately, this evaluator considered the generally consistent indications of the Inattention criterion symptoms of the DSM-5 as well as her noting that ADHD-related symptoms that have occurred since childhood, are consistent across situations and independent of mood, and have remained despite medication management of other endorsed medical and mental health-related concerns, to make a diagnosis of F90.0 Attention-Deficit/Hyperactivity Disorder, Predominately Inattentive Presentation, Mild. The specifier of "Mild" was assigned despite her having endorsed symptoms in excess of what is needed to make the diagnosis, as her and Mr. Chloe Allen results were largely not indicative of moderate or severe difficulties or impairment stemming from ADHD-related symptomatology. 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 Upon follow-up, Ms. Beitz expressed a belief that impairment from her endorsed ADHD symptoms is of moderate severity. While the number of endorsed symptoms and resulting impairment endorsed during the clinical interview would reflect moderate severity, this evaluator ultimately continued to err on the side of caution given the various validity concerns and her and Mr. Chloe Allen results.   Ms. Tregoning discussed a history of periods of low mood; sleep impairment; overeating-related behaviors; and two past suicide attempts. As such, the PHQ-9, GAD-7, and PAI were administered. Her results suggest she experiences moderate severity depression- and severe anxiety-related symptoms. However, given the limited scope of this evaluation, it was not possible to determine if the full criteria for depressive disorder, anxiety disorder, or eating disorder are met. Thus, she would likely benefit from further evaluation of these symptoms to definitively rule in or out the aforementioned disorders. Should any of the aforementioned be ruled in, they would likely be in addition to her diagnosis of ADHD, as she described her ADHD-related concerns as occurring before some of the concerns, as consistent across situations, and independent of mood.  DSM-5 Diagnostic Impressions: F90.0 Attention-Deficit/Hyperactivity Disorder, Predominately Inattentive Presentation, Mild  RECOMMENDATIONS: Ms. Fadely  would likely benefit from a consultation regarding medication for ADHD symptoms.   Individual therapeutic services may assist in processing a diagnosis of ADHD and discussing coping and compensatory strategies.  Ms. Berling would likely benefit from making use of strategies for ADHD symptoms:  Setting a timer to complete tasks. Break tasks into manageable chunks and spread them out over more extended periods with breaks.  Utilizing lists and day calendars to keep track of tasks.  Answering emails daily.  Improve listening skills by asking the speaker to give information in smaller chunks and asking for explanations and clarification as needed. Leaving more than the anticipated time to complete tasks. It may help to keep tasks brief, well within your attention span, and a mix of both high and low-interest tasks. Tasks may be gradually increased in length. Practice proactive planning by setting aside time every evening to plan for the next day (e.g., prepare needed materials or pack the car the night before).  Learn how to make a practical and reasonable "to-do" list of important tasks and priorities and always keep it easily accessible. Make additional copies in case it is lost or misplaced. Utilize visual reminders by posting appointments, "to-do lists," or schedules in strategic areas at home and work.  Practice using an appointment book, smartphone, or other tech device, or a daily planning calendar, and learn to write down appointments and commitments immediately. Keep notepads or use a portable audio recorder to capture important ideas that would be beneficial to recall later. Learn and practice time management skills. Purchase a programmable alarm watch or set an alarm on a smartphone to avoid losing track of time.  Use a color-coded file system, desk and closet organizers, storage boxes, or other organization devices to reduce clutter and improve efficiency and structure.  Implement ways to  become more aware of your actions and to inhibit or adjust them as warranted (e.g., reviewing videos of your actions, considering consequences of obeying or not obeying the rules of various upcoming situations, having a trusted other to discuss plans with, and/or provide cues to stop certain behaviors, and make visual cues for rules you would like to follow). The 4Rs: Read just one paragraph, recite out loud in a soft voice or whisper what was important in that material, write that material down in a notebook, then review what you just wrote. Stay flexible and be prepared to change your plans, as symptom breakthroughs and crises will likely occur periodically. Ms. Priestly may benefit from mindfulness training to address symptoms of inattention.  Mental alertness/energy can be raised by increasing exercise; improving sleep; eating a healthy diet; and managing stress. Consulting with a physician regarding any changes to the physical regimen is recommended. "Failing at Normal: An ADHD Success Story" by Harlene Solar is a great overview of ADHD. Dr. Nelwyn Pica also has a YouTube channel called, "Nelwyn Pica, PhD - Dedicated to ADHD Science+" with helpful videos on ADHD-related topics: [email or web address]  Applications:  RescueTime. Tracks your activities on your phone and/or computer to determine how productive you have been and what distracted you. Free two-week trial.  [email or web address] . It uses engineered audio that may reduce distractions and assist with focus. Free 15-day trial. Freedom. Allows you to highlight days and times you want to block yourself from certain sites or apps. Free trial. Merrily.  It allows you to input your bank accounts and creates a visual layout of information about your financial goals, budget management, alerts, etc. May offer a free trial. Boomerang. It allows you to schedule times an email is sent and to see if others have received or opened your  email. Ten messages free per month and a free trial of  the premium version. IFTTT. Uses "channels" to create various actions (e.g., if you are mentioned in an email, highlight it in your inbox, and if you miss a call, add it to a to-do list). Free and premium versions. Unroll.me. Cleans up your email by unsubscribing from what you do not want to receive while still getting everything you do. Free. Finish. It allows you to divide two-list tasks into short-term, mid-term, and long-term tasks and determine how much time is left for a task. Focus mode hides non-priority tasks.  Autosilent. Turns your phone ringer on and off based on specified calendars, geo-fences, timers, etc. $3.99. Freakyalarm. It makes you solve math problems to disable an alarm. $1.99. Wake N Shake. It makes you vigorously shake your phone to stop the alarm. $.99. Todoist. It allows you to add sub-tasks to tasks and includes email and web plugins to make it work across the system. Premium has location-based reminders, calendar sync, productive tracking, etc.  Sleep Cycle. Utilize your phone's motion sensors to catch movement while you are asleep. The alarm will wake you as early as 30 minutes before your alarm based on your lightest sleep phase and show you how daily activities affect your sleep quality.  Books: "Taking Charge of Adult ADHD Second Edition" by Dr. Nelwyn Pica "The ADHD Effect on Marriage" by Eleanor Bowers "The Couples Guide to Thriving with ADHD" by Eleanor Bowers "The ADHD Guide to Career Success" by Dr. Rollo Fess  Organizations that are a reliable source of information on ADHD:  Children and Adults with Attention-Deficit/Hyperactivity Disorder (CHADD): chadd.org  Attention Deficit Disorder Association (ADDA): hotternames.de ADD Resources: addresources.org ADD WareHouse: addwarehouse.com World Federation of ADHD: adhd-federation.org ADDConsults: addconsults.com Compilation of ADHD resources:  https://www.harrell.com/ Future evaluation, if deemed necessary, and/or to determine the effectiveness of recommended interventions.   Frederic Fire, Psy.D. Licensed Psychologist - [facility] [phone]   References  Pica, R. A. (2021). Taking charge of adult ADHD: proven strategies to succeed at work, at   home, and in relationships (pp. 6-10 and 272-276). [employer].              Frederic ONEIDA Fire, PsyD

## 2024-08-30 ENCOUNTER — Ambulatory Visit (INDEPENDENT_AMBULATORY_CARE_PROVIDER_SITE_OTHER): Admitting: Psychology

## 2024-08-30 DIAGNOSIS — F900 Attention-deficit hyperactivity disorder, predominantly inattentive type: Secondary | ICD-10-CM | POA: Diagnosis not present

## 2024-09-06 ENCOUNTER — Other Ambulatory Visit (HOSPITAL_BASED_OUTPATIENT_CLINIC_OR_DEPARTMENT_OTHER): Payer: Self-pay

## 2024-10-30 ENCOUNTER — Other Ambulatory Visit (HOSPITAL_BASED_OUTPATIENT_CLINIC_OR_DEPARTMENT_OTHER): Payer: Self-pay

## 2024-12-10 ENCOUNTER — Ambulatory Visit: Admitting: Licensed Clinical Social Worker
